# Patient Record
Sex: Female | Born: 1960 | Race: White | Hispanic: No | Marital: Married | Smoking: Former smoker
Health system: Southern US, Community
[De-identification: ages and names within clinical notes are randomized; demographics above are authoritative.]

## PROBLEM LIST (undated history)

## (undated) DIAGNOSIS — F329 Major depressive disorder, single episode, unspecified: Secondary | ICD-10-CM

## (undated) DIAGNOSIS — F32A Depression, unspecified: Secondary | ICD-10-CM

## (undated) HISTORY — DX: Depression, unspecified: F32.A

## (undated) HISTORY — PX: APPENDECTOMY: SHX54

## (undated) HISTORY — DX: Major depressive disorder, single episode, unspecified: F32.9

---

## 2009-10-06 ENCOUNTER — Emergency Department (HOSPITAL_COMMUNITY): Admission: EM | Admit: 2009-10-06 | Discharge: 2009-10-06 | Payer: Self-pay | Admitting: Emergency Medicine

## 2012-10-21 ENCOUNTER — Ambulatory Visit: Payer: Self-pay | Admitting: Obstetrics & Gynecology

## 2012-11-28 ENCOUNTER — Other Ambulatory Visit: Payer: Self-pay | Admitting: Rheumatology

## 2012-11-28 DIAGNOSIS — M79672 Pain in left foot: Secondary | ICD-10-CM

## 2012-11-30 ENCOUNTER — Ambulatory Visit
Admission: RE | Admit: 2012-11-30 | Discharge: 2012-11-30 | Disposition: A | Discharge status: Home / Self Care | Payer: 59 | Source: Ambulatory Visit | Attending: Rheumatology | Admitting: Rheumatology

## 2012-11-30 DIAGNOSIS — M79672 Pain in left foot: Secondary | ICD-10-CM

## 2013-01-20 ENCOUNTER — Ambulatory Visit (INDEPENDENT_AMBULATORY_CARE_PROVIDER_SITE_OTHER): Payer: 59 | Admitting: Obstetrics & Gynecology

## 2013-01-20 ENCOUNTER — Encounter: Payer: Self-pay | Admitting: Obstetrics & Gynecology

## 2013-01-20 ENCOUNTER — Other Ambulatory Visit: Payer: Self-pay | Admitting: Obstetrics & Gynecology

## 2013-01-20 VITALS — BP 117/78 | HR 70 | Temp 98.6°F | Ht 68.0 in | Wt 195.0 lb

## 2013-01-20 DIAGNOSIS — Z1231 Encounter for screening mammogram for malignant neoplasm of breast: Secondary | ICD-10-CM

## 2013-01-20 DIAGNOSIS — Z01419 Encounter for gynecological examination (general) (routine) without abnormal findings: Secondary | ICD-10-CM | POA: Insufficient documentation

## 2013-01-20 NOTE — Progress Notes (Signed)
.   Subjective:     Colleen Smith is a 52 y.o. female here for a routine exam and she would like her thyroid level check today.  No current complaints.  Personal health questionnaire reviewed: no.   Gynecologic History No LMP recorded. Patient is postmenopausal. Contraception: none Last Pap: 2012. Results were: normal Last mammogram: 2012. Results were: normal  Obstetric History OB History   Grav Para Term Preterm Abortions TAB SAB Ect Mult Living                   The following portions of the patient's history were reviewed and updated as appropriate: allergies, current medications, past family history, past medical history, past social history, past surgical history and problem list.  Review of Systems Pertinent items are noted in HPI.    Objective:    General appearance: alert Breasts: normal appearance, no masses or tenderness Abdomen: soft, non-tender; bowel sounds normal; no masses,  no organomegaly Pelvic: cervix normal in appearance, external genitalia normal, no adnexal masses or tenderness, uterus normal size, shape, and consistency and vagina normal without discharge      Assessment:    Healthy female exam.    Plan:    Education reviewed: smoking cessation. Mammogram ordered. Follow up prn.

## 2013-01-20 NOTE — Patient Instructions (Addendum)

## 2013-01-21 LAB — CHOLESTEROL, TOTAL: Cholesterol: 191 mg/dL (ref 0–200)

## 2013-01-21 LAB — HEMOGLOBIN A1C: Mean Plasma Glucose: 105 mg/dL (ref <117)

## 2013-01-21 LAB — TSH: TSH: 1.9 u[IU]/mL (ref 0.350–4.500)

## 2013-01-22 LAB — PAP IG, CT-NG NAA, HPV HIGH-RISK: HPV DNA High Risk: NOT DETECTED

## 2013-02-17 ENCOUNTER — Ambulatory Visit
Admission: RE | Admit: 2013-02-17 | Discharge: 2013-02-17 | Disposition: A | Discharge status: Home / Self Care | Payer: 59 | Source: Ambulatory Visit | Attending: Obstetrics & Gynecology | Admitting: Obstetrics & Gynecology

## 2013-02-17 DIAGNOSIS — Z1231 Encounter for screening mammogram for malignant neoplasm of breast: Secondary | ICD-10-CM

## 2014-01-21 ENCOUNTER — Ambulatory Visit: Payer: 59 | Admitting: Obstetrics & Gynecology

## 2014-02-17 ENCOUNTER — Encounter: Payer: Self-pay | Admitting: Podiatrist

## 2014-02-17 ENCOUNTER — Ambulatory Visit (INDEPENDENT_AMBULATORY_CARE_PROVIDER_SITE_OTHER): Payer: 59 | Admitting: Podiatrist

## 2014-02-17 ENCOUNTER — Ambulatory Visit (INDEPENDENT_AMBULATORY_CARE_PROVIDER_SITE_OTHER): Payer: 59

## 2014-02-17 VITALS — BP 123/72 | HR 68 | Resp 18

## 2014-02-17 DIAGNOSIS — R52 Pain, unspecified: Secondary | ICD-10-CM

## 2014-02-17 DIAGNOSIS — IMO0002 Reserved for concepts with insufficient information to code with codable children: Secondary | ICD-10-CM

## 2014-02-17 DIAGNOSIS — M792 Neuralgia and neuritis, unspecified: Secondary | ICD-10-CM

## 2014-02-17 DIAGNOSIS — G894 Chronic pain syndrome: Secondary | ICD-10-CM

## 2014-02-17 MED ORDER — PREGABALIN 75 MG PO CAPS: 75.0000 mg | ORAL_CAPSULE | Freq: Two times a day (BID) | ORAL | Status: DC

## 2014-02-17 NOTE — Progress Notes (Signed)
   Subjective:    Patient ID: Colleen Smith, female    DOB: 08/11/1960, 53 y.o.   MRN: [id number]  HPI my left foot is still hurting and sore and tender and constant pain up to my knee and ice packs and take over the counter medicines and swelling and burning and throbbing and I did have surgery on my left foot about 5 years with Dr Amalia Hailey     Review of Systems  Musculoskeletal: Positive for gait problem.       MUSCLE PAIN  All other systems reviewed and are negative.      Objective:   Physical Exam Patient is awake, alert, and oriented x 3.  In no acute distress.  Vascular status is intact with palpable pedal pulses at 2/4 DP and PT bilateral and capillary refill time within normal limits. Neurological sensation is also intact bilaterally via Semmes Weinstein monofilament at 5/5 sites. Light touch, vibratory sensation intact and hyper response is elicited, Achilles tendon reflex is intact. Dermatological exam reveals skin color, turger and texture as normal. No open lesions present.  Musculature intact with dorsiflexion, plantarflexion, inversion, eversion. No tenting of the pin. It appears to be in a good position radiographically.  Her complaints are mostly subjective and are nerve/ neuritis related.  She had a EMG study in the past that was negative.     Assessment & Plan:  Neuritis, pain syndrome  Plan: Recommended a trial of Lyrica 75-150 mg nightly for 3 weeks to see if this helps with any of the symptoms she is experiencing. We did discuss removing the pin however her symptoms don't appear to be from a retained pin.  We discussed TENS units and PT may consider these at the next visit.

## 2014-02-24 ENCOUNTER — Telehealth: Payer: Self-pay | Admitting: *Deleted

## 2014-02-24 MED ORDER — GABAPENTIN 300 MG PO CAPS: ORAL_CAPSULE | ORAL | Status: DC

## 2014-02-24 NOTE — Telephone Encounter (Signed)
Called patient and stated that DR EGERTON WROTE FOR GABAPENTIN. LISA

## 2014-02-24 NOTE — Telephone Encounter (Signed)
Sure I will switch the medication to gabapentin 300 qhs and q dinner

## 2014-03-10 ENCOUNTER — Ambulatory Visit: Payer: 59 | Admitting: Podiatrist

## 2014-03-17 ENCOUNTER — Ambulatory Visit: Payer: 59 | Admitting: Podiatrist

## 2014-03-24 ENCOUNTER — Ambulatory Visit: Payer: 59 | Admitting: Podiatrist

## 2014-03-31 ENCOUNTER — Ambulatory Visit (INDEPENDENT_AMBULATORY_CARE_PROVIDER_SITE_OTHER): Payer: 59 | Admitting: Podiatrist

## 2014-03-31 ENCOUNTER — Encounter: Payer: Self-pay | Admitting: Podiatrist

## 2014-03-31 VITALS — BP 115/74 | HR 75 | Resp 18

## 2014-03-31 DIAGNOSIS — M792 Neuralgia and neuritis, unspecified: Secondary | ICD-10-CM

## 2014-03-31 DIAGNOSIS — IMO0002 Reserved for concepts with insufficient information to code with codable children: Secondary | ICD-10-CM

## 2014-03-31 MED ORDER — GABAPENTIN 300 MG PO CAPS: ORAL_CAPSULE | ORAL | Status: DC

## 2014-03-31 NOTE — Patient Instructions (Signed)
There is a great neurosurgery group in [place] that can help figure out if you have sciatic nerve problems and help treat.    Dr. Earnie Larsson-- [facility]  [phone]

## 2014-04-08 NOTE — Progress Notes (Addendum)
Chief Complaint  Patient presents with  . Foot Problem    it is better and the medicine seems to be helping and some days are better than  others     HPI: Patient is 53 y.o. female who presents today for folllow up of neuritis symptomatology on the left foot.  She states the lyrica does appear to be helping some.   Physical Exam  Patient is awake, alert, and oriented x 3. In no acute distress. Vascular status is intact with palpable pedal pulses at 2/4 DP and PT bilateral and capillary refill time within normal limits. Neurological sensation is also intact bilaterally via Semmes Weinstein monofilament at 5/5 sites. Light touch, vibratory sensation intact and hyper response is elicited, Achilles tendon reflex is intact. Dermatological exam reveals skin color, turger and texture as normal. No open lesions present. Musculature intact with dorsiflexion, plantarflexion, inversion, eversion.  Subjective findings of nerve pain/ neuritis related. She had a EMG study in the past that was negative.   Assessment & Plan:   Neuritis, pain syndrome , chronic intractable pain  Plan: recommended continued use of the lyrica.  Also discussed a TENS  Unit sock for the patient as I think this will help with her chronic intractable pain as well.  We will see about setting her up for this needed modality.

## 2014-04-27 ENCOUNTER — Ambulatory Visit: Payer: 59 | Admitting: Obstetrics & Gynecology

## 2014-06-15 ENCOUNTER — Ambulatory Visit: Payer: 59 | Admitting: Obstetrics & Gynecology

## 2014-07-13 ENCOUNTER — Encounter: Payer: Self-pay | Admitting: *Deleted

## 2014-07-14 ENCOUNTER — Encounter: Payer: Self-pay | Admitting: Obstetrics & Gynecology

## 2014-07-21 ENCOUNTER — Telehealth: Payer: Self-pay | Admitting: *Deleted

## 2014-07-21 NOTE — Telephone Encounter (Signed)
Pt request to speak with Melody.

## 2014-07-21 NOTE — Telephone Encounter (Signed)
Tried to return patients phone call left message to call me back

## 2014-07-22 ENCOUNTER — Telehealth: Payer: Self-pay | Admitting: *Deleted

## 2014-07-22 MED ORDER — GABAPENTIN 300 MG PO CAPS: 300.0000 mg | ORAL_CAPSULE | Freq: Two times a day (BID) | ORAL | Status: DC

## 2014-07-22 NOTE — Telephone Encounter (Signed)
Called patient and let her know Rx was called in to pharmacy

## 2014-07-22 NOTE — Telephone Encounter (Signed)
PT HAS REQUESTED REFILL OF THE GABAPENTIN THROUGH PHARMACY HAS NOT HEARD ANYTHING ONLY HAS A FEW DAYS LEFT CAN YOU PLEASE CALL IN REFILLS

## 2014-07-22 NOTE — Telephone Encounter (Signed)
I CALLED IN A REFILL REQUEST TO PHARMACY LAST WEEK AND HAVE NOT GOTTEN A RESPONSE CAN YOU PLEASE CALL IN REFILLS FOR THE GABAPENTIN.  I ONLY HAVE A FEW DAYS LEFT THANK YOU

## 2014-07-22 NOTE — Telephone Encounter (Signed)
rx for gabapentin 300 bid called in

## 2014-09-17 ENCOUNTER — Ambulatory Visit: Payer: 59 | Admitting: Obstetrics & Gynecology

## 2014-10-06 ENCOUNTER — Ambulatory Visit (INDEPENDENT_AMBULATORY_CARE_PROVIDER_SITE_OTHER): Payer: 59 | Admitting: Obstetrics

## 2014-10-06 ENCOUNTER — Encounter: Payer: Self-pay | Admitting: Obstetrics

## 2014-10-06 VITALS — BP 130/88 | HR 70 | Temp 97.6°F | Ht 68.0 in | Wt 211.0 lb

## 2014-10-06 DIAGNOSIS — Z124 Encounter for screening for malignant neoplasm of cervix: Secondary | ICD-10-CM

## 2014-10-06 DIAGNOSIS — N841 Polyp of cervix uteri: Secondary | ICD-10-CM | POA: Diagnosis not present

## 2014-10-06 DIAGNOSIS — Z01419 Encounter for gynecological examination (general) (routine) without abnormal findings: Secondary | ICD-10-CM

## 2014-10-06 NOTE — Progress Notes (Addendum)
Subjective:        Colleen Smith is a 54 y.o. female here for a routine exam.  Current complaints: None.    Personal health questionnaire:  Is patient Ashkenazi Jewish, have a family history of breast and/or ovarian cancer: no Is there a family history of uterine cancer diagnosed at age < 41, gastrointestinal cancer, urinary tract cancer, family member who is a Field seismologist syndrome-associated carrier: no Is the patient overweight and hypertensive, family history of diabetes, personal history of gestational diabetes, preeclampsia or PCOS: no Is patient over 60, have PCOS,  family history of premature CHD under age 70, diabetes, smoke, have hypertension or peripheral artery disease:  no At any time, has a partner hit, kicked or otherwise hurt or frightened you?: no Over the past 2 weeks, have you felt down, depressed or hopeless?: no Over the past 2 weeks, have you felt little interest or pleasure in doing things?:no   Gynecologic History Patient's last menstrual period was 09/07/2014. Contraception: none Last Pap: 2014. Results were: normal Last mammogram: 2014. Results were: normal  Obstetric History OB History  No data available    Past Medical History  Diagnosis Date  . Depression     Past Surgical History  Procedure Laterality Date  . Appendectomy       Current outpatient prescriptions:  .  buPROPion (WELLBUTRIN XL) 300 MG 24 hr tablet, Take 300 mg by mouth daily., Disp: , Rfl:  .  gabapentin (NEURONTIN) 300 MG capsule, Take 1 capsule (300 mg total) by mouth 2 (two) times daily. 2 tabs at dinner and 1 just before going to sleep, Disp: 90 capsule, Rfl: 4 .  venlafaxine XR (EFFEXOR-XR) 150 MG 24 hr capsule, , Disp: , Rfl:  Allergies  Allergen Reactions  . Penicillins Rash    History  Substance Use Topics  . Smoking status: Light Tobacco Smoker -- 0.50 packs/day for 15 years  . Smokeless tobacco: Never Used  . Alcohol Use: No    History reviewed. No pertinent family  history.    Review of Systems  Constitutional: negative for fatigue and weight loss Respiratory: negative for cough and wheezing Cardiovascular: negative for chest pain, fatigue and palpitations Gastrointestinal: negative for abdominal pain and change in bowel habits Musculoskeletal:negative for myalgias Neurological: negative for gait problems and tremors Behavioral/Psych: negative for abusive relationship, depression Endocrine: negative for temperature intolerance   Genitourinary:negative for abnormal menstrual periods, genital lesions, hot flashes, sexual problems and vaginal discharge Integument/breast: negative for breast lump, breast tenderness, nipple discharge and skin lesion(s)    Objective:       BP 130/88 mmHg  Pulse 70  Temp(Src) 97.6 F (36.4 C)  Ht 5\' 8"  (1.727 m)  Wt 211 lb (95.709 kg)  BMI 32.09 kg/m2  LMP 09/07/2014 General:   alert  Skin:   no rash or abnormalities  Lungs:   clear to auscultation bilaterally  Heart:   regular rate and rhythm, S1, S2 normal, no murmur, click, rub or gallop  Breasts:   normal without suspicious masses, skin or nipple changes or axillary nodes  Abdomen:  normal findings: no organomegaly, soft, non-tender and no hernia  Pelvis:  External genitalia: normal general appearance Urinary system: urethral meatus normal and bladder without fullness, nontender Vaginal: normal without tenderness, induration or masses Cervix: endocervical polyp, broad based Adnexa: normal bimanual exam Uterus: anteverted and non-tender, normal size   Lab Review Urine pregnancy test Labs reviewed yes Radiologic studies reviewed yes    Assessment:  Healthy female exam.    Cervical polyp, broad based.   Plan:    Education reviewed: calcium supplements, low fat, low cholesterol diet, self breast exams and weight bearing exercise. Mammogram ordered. Follow up in: 2 weeks. Schedule outpatient cervical polypectomy   No orders of the defined  types were placed in this encounter.   Orders Placed This Encounter  Procedures  . SureSwab, Vaginosis/Vaginitis Plus  . Comprehensive metabolic panel  . TSH  . CBC

## 2014-10-07 LAB — CBC
HCT: 45 % (ref 36.0–46.0)
HEMOGLOBIN: 14.9 g/dL (ref 12.0–15.0)
MCH: 29.9 pg (ref 26.0–34.0)
MCHC: 33.1 g/dL (ref 30.0–36.0)
MCV: 90.2 fL (ref 78.0–100.0)
MPV: 10.9 fL (ref 8.6–12.4)
PLATELETS: 341 10*3/uL (ref 150–400)
RBC: 4.99 MIL/uL (ref 3.87–5.11)
RDW: 14.4 % (ref 11.5–15.5)
WBC: 8.2 10*3/uL (ref 4.0–10.5)

## 2014-10-07 LAB — COMPREHENSIVE METABOLIC PANEL
ALK PHOS: 55 U/L (ref 39–117)
ALT: 18 U/L (ref 0–35)
AST: 20 U/L (ref 0–37)
Albumin: 4.7 g/dL (ref 3.5–5.2)
BILIRUBIN TOTAL: 0.3 mg/dL (ref 0.2–1.2)
BUN: 15 mg/dL (ref 6–23)
CO2: 22 meq/L (ref 19–32)
Calcium: 9.4 mg/dL (ref 8.4–10.5)
Chloride: 102 mEq/L (ref 96–112)
Creat: 0.97 mg/dL (ref 0.50–1.10)
Glucose, Bld: 71 mg/dL (ref 70–99)
Potassium: 4.4 mEq/L (ref 3.5–5.3)
SODIUM: 137 meq/L (ref 135–145)
TOTAL PROTEIN: 7.4 g/dL (ref 6.0–8.3)

## 2014-10-07 LAB — TSH: TSH: 3.402 u[IU]/mL (ref 0.350–4.500)

## 2014-10-08 LAB — PAP IG AND HPV HIGH-RISK: HPV DNA HIGH RISK: DETECTED — AB

## 2014-10-08 LAB — SURESWAB, VAGINOSIS/VAGINITIS PLUS
Atopobium vaginae: 6.5 Log (cells/mL)
BV CATEGORY: UNDETERMINED — AB
C. GLABRATA, DNA: NOT DETECTED
C. TROPICALIS, DNA: NOT DETECTED
C. albicans, DNA: NOT DETECTED
C. parapsilosis, DNA: NOT DETECTED
C. trachomatis RNA, TMA: NOT DETECTED
LACTOBACILLUS SPECIES: 7.6 Log (cells/mL)
MEGASPHAERA SPECIES: 8 Log (cells/mL)
N. gonorrhoeae RNA, TMA: NOT DETECTED
T. VAGINALIS RNA, QL TMA: NOT DETECTED

## 2014-10-29 ENCOUNTER — Other Ambulatory Visit: Payer: Self-pay | Admitting: Obstetrics

## 2014-10-29 DIAGNOSIS — B9689 Other specified bacterial agents as the cause of diseases classified elsewhere: Secondary | ICD-10-CM

## 2014-10-29 DIAGNOSIS — N760 Acute vaginitis: Principal | ICD-10-CM

## 2014-10-29 MED ORDER — TINIDAZOLE 500 MG PO TABS: 1000.0000 mg | ORAL_TABLET | Freq: Every day | ORAL | Status: DC

## 2014-10-30 ENCOUNTER — Other Ambulatory Visit: Payer: Self-pay | Admitting: Obstetrics

## 2014-10-30 ENCOUNTER — Ambulatory Visit (INDEPENDENT_AMBULATORY_CARE_PROVIDER_SITE_OTHER): Payer: 59 | Admitting: Obstetrics

## 2014-10-30 VITALS — BP 121/79 | HR 87 | Temp 98.1°F | Wt 212.0 lb

## 2014-10-30 DIAGNOSIS — G8918 Other acute postprocedural pain: Secondary | ICD-10-CM

## 2014-10-30 DIAGNOSIS — Z01812 Encounter for preprocedural laboratory examination: Secondary | ICD-10-CM | POA: Diagnosis not present

## 2014-10-30 DIAGNOSIS — N841 Polyp of cervix uteri: Secondary | ICD-10-CM | POA: Diagnosis not present

## 2014-10-30 DIAGNOSIS — R87612 Low grade squamous intraepithelial lesion on cytologic smear of cervix (LGSIL): Secondary | ICD-10-CM

## 2014-10-30 LAB — POCT URINE PREGNANCY: PREG TEST UR: NEGATIVE

## 2014-10-30 MED ORDER — HYDROCODONE-IBUPROFEN 5-200 MG PO TABS: 1.0000 | ORAL_TABLET | Freq: Three times a day (TID) | ORAL | Status: DC | PRN

## 2014-10-30 NOTE — Progress Notes (Signed)
Colposcopy Procedure Note Cervical Polypectomy Note  Indications: Pap smear 1 months ago showed: low-grade squamous intraepithelial neoplasia (LGSIL - encompassing HPV,mild dysplasia,CIN I). The prior pap showed no abnormalities.  Prior cervical/vaginal disease: normal exam without visible pathology. Prior cervical treatment: no treatment.  Procedure Details  The risks and benefits of the procedure and Written informed consent obtained.  A time-out was performed confirming the patient, procedure and allergy status  Speculum placed in vagina and excellent visualization of cervix achieved, cervix swabbed x 3 with acetic acid solution.  Findings: Cervix: endocervical polyp ~ 1-2 cm size; SCJ visualized 360 degrees without lesions, endocervical curettage performed, cervical  taken at 6 and 12 o'clock, specimen labelled and sent to pathology and hemostasis achieved with silver nitrate.  The endocervical polyp then grasped with a dressing forcep and twisted off at its base and submitted to pathology.  Hemostasis of base of polyp obtained with silver nitrate.   Vaginal inspection: normal without visible lesions. Vulvar colposcopy: vulvar colposcopy not performed.   Physical Exam   Specimens: ECC, Cervical Biopsies, Cervical Polyp  Complications: none.  Plan: Specimens labelled and sent to Pathology. Will base further treatment on Pathology findings. Post biopsy instructions given to patient. Return to discuss Pathology results in 2 weeks.

## 2014-11-03 ENCOUNTER — Other Ambulatory Visit: Payer: Self-pay | Admitting: Obstetrics

## 2014-11-03 LAB — SURESWAB, VAGINOSIS/VAGINITIS PLUS
ATOPOBIUM VAGINAE: 7.3 Log (cells/mL)
C. ALBICANS, DNA: NOT DETECTED
C. PARAPSILOSIS, DNA: NOT DETECTED
C. TRACHOMATIS RNA, TMA: NOT DETECTED
C. glabrata, DNA: NOT DETECTED
C. tropicalis, DNA: NOT DETECTED
Gardnerella vaginalis: >8 Log (cells/mL)
LACTOBACILLUS SPECIES: NOT DETECTED Log (cells/mL)
MEGASPHAERA SPECIES: 8 Log (cells/mL)
N. gonorrhoeae RNA, TMA: NOT DETECTED
T. VAGINALIS RNA, QL TMA: NOT DETECTED

## 2014-11-04 ENCOUNTER — Telehealth: Payer: Self-pay | Admitting: *Deleted

## 2014-11-04 NOTE — Telephone Encounter (Signed)
Patient request call back. 4/20  10:00 Call to patient- patient reports she is still having spotting and some cramping. Reassured patient- she may be starting a cycle - she is to call if fever,increase pain and heavy bleeding. Patient notified of her results and recommended follow up and she is to keep her appointment for follow up to check cervix. Patient understands and will call if she feels she needs to be seen.

## 2014-11-18 ENCOUNTER — Ambulatory Visit: Payer: 59 | Admitting: Obstetrics

## 2014-11-25 ENCOUNTER — Ambulatory Visit (INDEPENDENT_AMBULATORY_CARE_PROVIDER_SITE_OTHER): Payer: 59 | Admitting: Obstetrics

## 2014-11-25 ENCOUNTER — Encounter: Payer: Self-pay | Admitting: Obstetrics

## 2014-11-25 VITALS — BP 108/74 | HR 82 | Wt 207.0 lb

## 2014-11-25 DIAGNOSIS — R896 Abnormal cytological findings in specimens from other organs, systems and tissues: Secondary | ICD-10-CM | POA: Diagnosis not present

## 2014-11-25 DIAGNOSIS — IMO0002 Reserved for concepts with insufficient information to code with codable children: Secondary | ICD-10-CM

## 2014-11-25 NOTE — Progress Notes (Signed)
Patient ID: Colleen Smith, female   DOB: Jun 24, 1961, 54 y.o.   MRN: [id number]  Chief Complaint  Patient presents with  . Follow-up    colposcopy    HPI Colleen Smith is a 54 y.o. female.  Presents for results of colposcopic biopsies.  H/O LGSIL on pap smear. HPI  Past Medical History  Diagnosis Date  . Depression     Past Surgical History  Procedure Laterality Date  . Appendectomy      History reviewed. No pertinent family history.  Social History History  Substance Use Topics  . Smoking status: Light Tobacco Smoker -- 0.50 packs/day for 15 years  . Smokeless tobacco: Never Used  . Alcohol Use: No    Allergies  Allergen Reactions  . Penicillins Rash    Current Outpatient Prescriptions  Medication Sig Dispense Refill  . buPROPion (WELLBUTRIN XL) 300 MG 24 hr tablet Take 300 mg by mouth daily.    Marland Kitchen gabapentin (NEURONTIN) 300 MG capsule Take 1 capsule (300 mg total) by mouth 2 (two) times daily. 2 tabs at dinner and 1 just before going to sleep 90 capsule 4  . hydrocodone-ibuprofen (VICOPROFEN) 5-200 MG per tablet Take 1 tablet by mouth every 8 (eight) hours as needed for pain. 40 tablet 0  . tinidazole (TINDAMAX) 500 MG tablet Take 2 tablets (1,000 mg total) by mouth daily with breakfast. 10 tablet 2  . venlafaxine XR (EFFEXOR-XR) 150 MG 24 hr capsule      No current facility-administered medications for this visit.    Review of Systems Review of Systems Constitutional: negative for fatigue and weight loss Respiratory: negative for cough and wheezing Cardiovascular: negative for chest pain, fatigue and palpitations Gastrointestinal: negative for abdominal pain and change in bowel habits Genitourinary:negative Integument/breast: negative for nipple discharge Musculoskeletal:negative for myalgias Neurological: negative for gait problems and tremors Behavioral/Psych: negative for abusive relationship, depression Endocrine: negative for temperature intolerance      Blood pressure 108/74, pulse 82, weight 207 lb (93.895 kg), last menstrual period 10/09/2014.  Physical Exam Physical Exam:  Deferred  100% of 10 min visit spent on counseling and coordination of care.    Data Reviewed Pap smear Pathology  Assessment     LGSIL     Plan    Repeat pap 6 months.  No orders of the defined types were placed in this encounter.   No orders of the defined types were placed in this encounter.

## 2014-12-08 ENCOUNTER — Telehealth: Payer: Self-pay | Admitting: *Deleted

## 2014-12-08 NOTE — Telephone Encounter (Signed)
Pt request call from Melody, left only DOB, and phone number.

## 2014-12-11 NOTE — Telephone Encounter (Signed)
Patient called to see if we had received request for her medication refill.  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

## 2014-12-22 ENCOUNTER — Telehealth: Payer: Self-pay | Admitting: *Deleted

## 2014-12-22 MED ORDER — GABAPENTIN 300 MG PO CAPS: 300.0000 mg | ORAL_CAPSULE | Freq: Two times a day (BID) | ORAL | Status: DC

## 2014-12-22 NOTE — Telephone Encounter (Signed)
Pt request refill of Gabapentin, while in office for appt with Melody Leslee Home.  Dr. Paulla Dolly ordered refill Gabapentin as previously and have pt established with a [facility] doctor prior to future refills.

## 2015-02-19 ENCOUNTER — Ambulatory Visit (INDEPENDENT_AMBULATORY_CARE_PROVIDER_SITE_OTHER): Payer: 59 | Admitting: Podiatry

## 2015-02-19 ENCOUNTER — Encounter: Payer: Self-pay | Admitting: Podiatry

## 2015-02-19 ENCOUNTER — Ambulatory Visit (INDEPENDENT_AMBULATORY_CARE_PROVIDER_SITE_OTHER): Payer: 59

## 2015-02-19 VITALS — BP 123/76 | HR 65 | Resp 14

## 2015-02-19 DIAGNOSIS — M79672 Pain in left foot: Secondary | ICD-10-CM

## 2015-02-19 DIAGNOSIS — Z472 Encounter for removal of internal fixation device: Secondary | ICD-10-CM | POA: Diagnosis not present

## 2015-02-19 DIAGNOSIS — L600 Ingrowing nail: Secondary | ICD-10-CM | POA: Diagnosis not present

## 2015-02-20 NOTE — Progress Notes (Signed)
Subjective:     Patient ID: Colleen Smith, female   DOB: [birth year], 54 y.o.   MRN: [id number]  HPI patient states she has had continued pain in her left first metatarsal and states that the pin is bothering her that had been placed previous surgery and the big toenail and second nail have really been bothering her. States that the gabapentin helps her some at night but she's not sure if the dose needs to be changed   Review of Systems  All other systems reviewed and are negative.      Objective:   Physical Exam  Constitutional: She is oriented to person, place, and time.  Cardiovascular: Intact distal pulses.   Musculoskeletal: Normal range of motion.  Neurological: She is oriented to person, place, and time.  Skin: Skin is warm and dry.  Nursing note and vitals reviewed.  neurovascular status unchanged with patient having good digital perfusion and noted to having discomfort on the first metatarsal shaft left with discomfort that radiates into the big toe with incurvation of the nailbed left hallux on both medial lateral side and pain when palpated. Second nail is thickened and she cannot cut it and she has tried to trim it and pad it. States she's tried numerous treatments for her foot including long-term gabapentin and other anti-inflammatory's without relief of symptoms along with orthotics     Assessment:     Inflammatory changes with possible irritation to pin left first metatarsal with probable ingrown toenail is part of pathology but very difficult condition to discuss long-term or whether will not we'll be able to achieve permanent relief of symptoms    Plan:     Reviewed H&P and x-ray and do to long-term symptoms it's been recommended that pin removal be accomplished at this time along with nail removal of the big toe and second toenail. Patient wants this done absolutely understanding that this will probably not reduce all symptoms that she is experiencing and it's very  possible that long-term she will need increase in her gabapentin and also possible TENS unit to try to help reduce her chronic pain she experiences at this point though she wants to pursue surgical intervention and I allowed her to read consent form reviewing alternative treatments and complications associated with procedures. Patient wants the surgery and at this time signs consent form and we reviewed procedures and risk. She is scheduled for outpatient surgery [facility] surgical center and she was given ample opportunities to answer questions. Preoperative instructions dispensed and scheduled for surgery and encouraged to call with questions

## 2015-02-23 ENCOUNTER — Telehealth: Payer: Self-pay | Admitting: *Deleted

## 2015-02-23 NOTE — Telephone Encounter (Signed)
I called patient in error.  I told her to ignore message.  We have you scheduled for 03/09/2015.

## 2015-02-24 ENCOUNTER — Telehealth: Payer: Self-pay | Admitting: *Deleted

## 2015-02-24 NOTE — Telephone Encounter (Signed)
"  I seen the doctor on Thursday.  He scheduled me for surgery.  I don't see the time on my paperwork.  If I do not answer, leave me a message.  If I have to, I'll return your call."

## 2015-02-25 ENCOUNTER — Telehealth: Payer: Self-pay | Admitting: *Deleted

## 2015-02-25 NOTE — Telephone Encounter (Signed)
I'm returning your call from yesterday.  "I've already spoke to someone and they gave me the time."  Was it the surgical center?  "I think so.  Thanks for calling."

## 2015-02-25 NOTE — Telephone Encounter (Signed)
I called and informed Caren Griffins that patient's authorization number for surgery scheduled for 03/09/2015 is [id number].

## 2015-03-09 ENCOUNTER — Encounter: Payer: Self-pay | Admitting: *Deleted

## 2015-03-09 DIAGNOSIS — L600 Ingrowing nail: Secondary | ICD-10-CM | POA: Diagnosis not present

## 2015-03-09 DIAGNOSIS — Z4889 Encounter for other specified surgical aftercare: Secondary | ICD-10-CM | POA: Diagnosis not present

## 2015-03-10 ENCOUNTER — Telehealth: Payer: Self-pay | Admitting: *Deleted

## 2015-03-10 NOTE — Telephone Encounter (Signed)
Pt states surgery yesterday to remove pins and ingrown toenail procedure, and the toenail area is killing her.  I instructed pt to remove the toenail dressing only, cleanse the area with antibacterial soap, rinse well and cover with a neosporin bandaid, but to keep the remain dressing dry and in place until seen at [facility].  Pt states understanding.

## 2015-03-15 ENCOUNTER — Encounter: Payer: Self-pay | Admitting: Podiatry

## 2015-03-15 ENCOUNTER — Ambulatory Visit (INDEPENDENT_AMBULATORY_CARE_PROVIDER_SITE_OTHER): Payer: 59

## 2015-03-15 ENCOUNTER — Ambulatory Visit (INDEPENDENT_AMBULATORY_CARE_PROVIDER_SITE_OTHER): Payer: 59 | Admitting: Podiatry

## 2015-03-15 VITALS — BP 118/74 | HR 71 | Resp 16

## 2015-03-15 DIAGNOSIS — Z472 Encounter for removal of internal fixation device: Secondary | ICD-10-CM | POA: Diagnosis not present

## 2015-03-15 DIAGNOSIS — L600 Ingrowing nail: Secondary | ICD-10-CM

## 2015-03-15 DIAGNOSIS — Z9889 Other specified postprocedural states: Secondary | ICD-10-CM | POA: Diagnosis not present

## 2015-03-15 MED ORDER — GABAPENTIN 300 MG PO CAPS: 300.0000 mg | ORAL_CAPSULE | Freq: Two times a day (BID) | ORAL | Status: DC

## 2015-03-15 NOTE — Patient Instructions (Signed)

## 2015-03-15 NOTE — Progress Notes (Signed)
Subjective:     Patient ID: Colleen Smith, female   DOB: Sep 25, 1960, 54 y.o.   MRN: [id number]  HPI patient states I'm doing fine with my left foot but I need these right big toenail second nail removed as they are also painful and a fungus to be out of work this week it's a good time to do it. I've had minimal discomfort with the left   Review of Systems     Objective:   Physical Exam Neurovascular status intact muscle strength adequate with well-healing surgical site left first metatarsal and left hallux and second nailbeds with a bed that is healing well with no proximal edema erythema drainage noted. The right shows incurvated hallux and second nails that are painful when pressed from a dorsal direction    Assessment:     Healing well post surgery left foot with damaged incurvated hallux and second nail right    Plan:     Reviewed both conditions and applied sterile dressings to the left. For the right I went ahead and I recommended nail removal and explained procedure and risk and patient wants surgery. I went ahead and infiltrated with 120 mg I can Marcaine mixture applied sterile prep and remove the hallux and second nail right exposed matrix and applied phenol 5 applications to the hallux or applications to the second toe followed by alcohol lavage and sterile dressing. Gave instructions on soaks and reappoint

## 2015-03-16 ENCOUNTER — Telehealth: Payer: Self-pay | Admitting: *Deleted

## 2015-03-16 NOTE — Telephone Encounter (Signed)
Called patient at [phone] (Cell #) to check to see how they were doing from their ingrown toenail procedure that was done yesterday, March 15, 2015. Pt stated, "Toe throbbed last night". Pt took ibuprofen yesterday with some relief. Pt also stated, "toe is still sore, but feels better". Pt did soak toe this morning with some relief.

## 2015-03-18 ENCOUNTER — Telehealth: Payer: Self-pay

## 2015-03-18 NOTE — Telephone Encounter (Signed)
Spoke with pt regarding post op care and nail avulsion care. She stated that her toes were sore and slightly red from procedure. She denied any blistering or increased pain in the area. Advised to continue with soaking toes and watch for increased redness and drainage. She had concerns about going back to work and continuing to wear Darco shoe. I advised her to continue with Darco shoe and try to transition in a supportive shoe if she can tolerate it. Call with any questions or concerns

## 2015-03-24 NOTE — Progress Notes (Signed)
Surgery performed at [facility]; Removal Fixation Deep Kwire/ Screw and Exc. Nail Perm 1st and 2nd digit left foot.  Prescription for Vicodin 10/325 was written for pain,quantity 25.

## 2015-03-25 ENCOUNTER — Encounter: Payer: Self-pay | Admitting: Podiatry

## 2015-03-25 ENCOUNTER — Ambulatory Visit (INDEPENDENT_AMBULATORY_CARE_PROVIDER_SITE_OTHER): Payer: 59 | Admitting: Podiatry

## 2015-03-25 VITALS — BP 123/79 | HR 76 | Resp 16

## 2015-03-25 DIAGNOSIS — Z472 Encounter for removal of internal fixation device: Secondary | ICD-10-CM

## 2015-03-25 DIAGNOSIS — L600 Ingrowing nail: Secondary | ICD-10-CM

## 2015-03-25 DIAGNOSIS — M779 Enthesopathy, unspecified: Secondary | ICD-10-CM

## 2015-03-25 DIAGNOSIS — Z9889 Other specified postprocedural states: Secondary | ICD-10-CM

## 2015-03-25 NOTE — Progress Notes (Signed)
Subjective:     Patient ID: Colleen Smith, female   DOB: 05/13/1961, 54 y.o.   MRN: [id number]  HPI patient states I'm doing fine but I still have pain on top of my left foot   Review of Systems     Objective:   Physical Exam Neurovascular status unchanged with nails healing well hallux second left hallux second right with removal of pin wound edges coapted well and discomfort in the dorsum of the left foot around the second and third metatarsal bones    Assessment:     Inflammatory tendinitis left with well-healing surgical sites bilateral    Plan:     Review tendinitis and considerations for heat and ice therapy and long-term orthotics. Reappoint for [place] to reevaluate

## 2015-04-23 ENCOUNTER — Ambulatory Visit (INDEPENDENT_AMBULATORY_CARE_PROVIDER_SITE_OTHER): Payer: 59

## 2015-04-23 ENCOUNTER — Ambulatory Visit (INDEPENDENT_AMBULATORY_CARE_PROVIDER_SITE_OTHER): Payer: 59 | Admitting: Podiatry

## 2015-04-23 VITALS — BP 139/73 | HR 72 | Resp 16

## 2015-04-23 DIAGNOSIS — M779 Enthesopathy, unspecified: Secondary | ICD-10-CM | POA: Diagnosis not present

## 2015-04-23 DIAGNOSIS — B351 Tinea unguium: Secondary | ICD-10-CM | POA: Diagnosis not present

## 2015-04-23 DIAGNOSIS — Z472 Encounter for removal of internal fixation device: Secondary | ICD-10-CM

## 2015-04-23 DIAGNOSIS — L03019 Cellulitis of unspecified finger: Secondary | ICD-10-CM | POA: Diagnosis not present

## 2015-04-23 DIAGNOSIS — Z9889 Other specified postprocedural states: Secondary | ICD-10-CM

## 2015-04-23 DIAGNOSIS — IMO0002 Reserved for concepts with insufficient information to code with codable children: Secondary | ICD-10-CM

## 2015-04-23 MED ORDER — CEPHALEXIN 500 MG PO CAPS: 500.0000 mg | ORAL_CAPSULE | Freq: Two times a day (BID) | ORAL | Status: DC

## 2015-04-23 MED ORDER — TERBINAFINE HCL 250 MG PO TABS: ORAL_TABLET | ORAL | Status: DC

## 2015-04-24 NOTE — Progress Notes (Signed)
Subjective:     Patient ID: Colleen Smith, female   DOB: Sep 20, 1960, 54 y.o.   MRN: [id number]  HPI patient states I'm doing okay with the pins are removed and I don't think I'm getting quite the same pain but I do have pain in my feet in general and the nailbeds on my left are not quite healing as quickly as I would like   Review of Systems     Objective:   Physical Exam Neurovascular status intact muscle strength adequate range of motion within normal limits with patient having slight drainage on the dorsum of the left second toe where we removed and nail previously and moderate discomfort plantar aspect both feet. When I move the big toe joint. No longer hurts which is a good sign after having fixation removal    Assessment:     Generalized tendinitis of both feet along with left nailbed that's not healing completely at this time    Plan:     Reviewed all conditions and x-ray the left foot and today I went ahead and scanned for custom orthotics to reduce plantar pressures against her feet. I placed her on antibiotics cephalexin twice a day to try to help with the continued drainage and if any increased redness or other issues were to occur she is to let us know immediately

## 2015-05-14 ENCOUNTER — Ambulatory Visit: Payer: 59 | Admitting: *Deleted

## 2015-05-14 DIAGNOSIS — M779 Enthesopathy, unspecified: Secondary | ICD-10-CM

## 2015-05-14 NOTE — Progress Notes (Signed)
Patient ID: Colleen Smith, female   DOB: 04/02/1961, 54 y.o.   MRN: [id number] Patient presents for orthotic pick up.  Verbal and written break in and wear instructions given.  Patient will follow up in 4 weeks if symptoms worsen or fail to improve.

## 2015-05-14 NOTE — Patient Instructions (Signed)

## 2015-05-31 ENCOUNTER — Ambulatory Visit: Payer: 59 | Admitting: Obstetrics

## 2015-06-14 ENCOUNTER — Ambulatory Visit: Payer: 59 | Admitting: Podiatry

## 2015-06-21 ENCOUNTER — Ambulatory Visit: Payer: 59 | Admitting: Obstetrics

## 2015-10-04 ENCOUNTER — Telehealth: Payer: Self-pay | Admitting: *Deleted

## 2015-10-04 NOTE — Telephone Encounter (Addendum)
Pt states she is having problems with the bottom of her feet again and wanted to know if Dr. Paulla Dolly would refill as the 7 days on and 3 weeks off for 4 months again.  10/05/2015-Dr. Regal ordered refill the Lamisil as previously.  Orders called to pt and pharmacy.

## 2015-10-05 ENCOUNTER — Telehealth: Payer: Self-pay | Admitting: *Deleted

## 2015-10-05 MED ORDER — TERBINAFINE HCL 250 MG PO TABS: ORAL_TABLET | ORAL | Status: DC

## 2015-10-05 NOTE — Telephone Encounter (Signed)
Entered in error

## 2015-11-02 NOTE — Telephone Encounter (Signed)
Entered in error

## 2015-12-14 ENCOUNTER — Ambulatory Visit (INDEPENDENT_AMBULATORY_CARE_PROVIDER_SITE_OTHER): Payer: BLUE CROSS/BLUE SHIELD | Admitting: Obstetrics

## 2015-12-14 ENCOUNTER — Encounter: Payer: Self-pay | Admitting: Obstetrics

## 2015-12-14 VITALS — BP 126/81 | HR 65 | Temp 98.5°F | Wt 205.0 lb

## 2015-12-14 DIAGNOSIS — Z780 Asymptomatic menopausal state: Secondary | ICD-10-CM

## 2015-12-14 DIAGNOSIS — IMO0002 Reserved for concepts with insufficient information to code with codable children: Secondary | ICD-10-CM

## 2015-12-14 DIAGNOSIS — Z01419 Encounter for gynecological examination (general) (routine) without abnormal findings: Secondary | ICD-10-CM | POA: Diagnosis not present

## 2015-12-14 NOTE — Progress Notes (Signed)
Subjective:        Colleen Smith is a 55 y.o. female here for a routine exam.  Current complaints: None.    Personal health questionnaire:  Is patient Ashkenazi Jewish, have a family history of breast and/or ovarian cancer: no Is there a family history of uterine cancer diagnosed at age < 27, gastrointestinal cancer, urinary tract cancer, family member who is a Field seismologist syndrome-associated carrier: no Is the patient overweight and hypertensive, family history of diabetes, personal history of gestational diabetes, preeclampsia or PCOS: no Is patient over 53, have PCOS,  family history of premature CHD under age 17, diabetes, smoke, have hypertension or peripheral artery disease:  no At any time, has a partner hit, kicked or otherwise hurt or frightened you?: no Over the past 2 weeks, have you felt down, depressed or hopeless?: no Over the past 2 weeks, have you felt little interest or pleasure in doing things?:no   Gynecologic History Patient's last menstrual period was 10/09/2014 (exact date). Contraception: post menopausal status Last Pap: 2016. Results were: abnormal ( LGSIL, positive HPV ) Last mammogram: 2016. Results were: normal  Obstetric History OB History  No data available    Past Medical History  Diagnosis Date  . Depression     Past Surgical History  Procedure Laterality Date  . Appendectomy       Current outpatient prescriptions:  .  buPROPion (WELLBUTRIN XL) 300 MG 24 hr tablet, Take 300 mg by mouth daily., Disp: , Rfl:  .  sertraline (ZOLOFT) 100 MG tablet, Take 150 mg by mouth daily., Disp: , Rfl:  Allergies  Allergen Reactions  . Vicodin [Hydrocodone-Acetaminophen] Itching  . Penicillins Rash    Social History  Substance Use Topics  . Smoking status: Former Smoker -- 0.50 packs/day for 15 years  . Smokeless tobacco: Never Used  . Alcohol Use: No    History reviewed. No pertinent family history.    Review of Systems  Constitutional: negative for  fatigue and weight loss Respiratory: negative for cough and wheezing Cardiovascular: negative for chest pain, fatigue and palpitations Gastrointestinal: negative for abdominal pain and change in bowel habits Musculoskeletal:negative for myalgias Neurological: negative for gait problems and tremors Behavioral/Psych: negative for abusive relationship, depression Endocrine: negative for temperature intolerance   Genitourinary:negative for abnormal menstrual periods, genital lesions, hot flashes, sexual problems and vaginal discharge Integument/breast: negative for breast lump, breast tenderness, nipple discharge and skin lesion(s)    Objective:       BP 126/81 mmHg  Pulse 65  Temp(Src) 98.5 F (36.9 C)  Wt 205 lb (92.987 kg)  LMP 10/09/2014 (Exact Date) General:   alert  Skin:   no rash or abnormalities  Lungs:   clear to auscultation bilaterally  Heart:   regular rate and rhythm, S1, S2 normal, no murmur, click, rub or gallop  Breasts:   normal without suspicious masses, skin or nipple changes or axillary nodes  Abdomen:  normal findings: no organomegaly, soft, non-tender and no hernia  Pelvis:  External genitalia: normal general appearance Urinary system: urethral meatus normal and bladder without fullness, nontender Vaginal: normal without tenderness, induration or masses Cervix: normal appearance Adnexa: normal bimanual exam Uterus: anteverted and non-tender, normal size   Lab Review Urine pregnancy test Labs reviewed yes Radiologic studies reviewed yes   Assessment:    Healthy female exam.   Postmenopause.  Stable clinically.  LGSIL of cervix.  Colposcopic biopsies confirm.  Plan:    Repeat pap yearly.  Education reviewed:  calcium supplements, depression evaluation, low fat, low cholesterol diet, safe sex/STD prevention, self breast exams and weight bearing exercise. Follow up in: 1 year.   Meds ordered this encounter  Medications  . sertraline (ZOLOFT) 100 MG  tablet    Sig: Take 150 mg by mouth daily.   No orders of the defined types were placed in this encounter.

## 2015-12-17 LAB — NUSWAB VG+, CANDIDA 6SP
CANDIDA ALBICANS, NAA: NEGATIVE
CANDIDA PARAPSILOSIS, NAA: NEGATIVE
CHLAMYDIA TRACHOMATIS, NAA: NEGATIVE
Candida glabrata, NAA: NEGATIVE
Candida krusei, NAA: NEGATIVE
Candida lusitaniae, NAA: NEGATIVE
Candida tropicalis, NAA: NEGATIVE
Neisseria gonorrhoeae, NAA: NEGATIVE
Trich vag by NAA: NEGATIVE

## 2015-12-18 LAB — PAP IG AND HPV HIGH-RISK: PAP Smear Comment: 0

## 2015-12-18 LAB — HPV, LOW VOLUME (REFLEX): HPV low volume reflex: POSITIVE — AB

## 2015-12-20 ENCOUNTER — Ambulatory Visit: Payer: Self-pay | Admitting: Podiatry

## 2016-01-05 ENCOUNTER — Ambulatory Visit: Payer: Self-pay | Admitting: Podiatry

## 2016-12-14 ENCOUNTER — Ambulatory Visit: Payer: Self-pay | Admitting: Obstetrics

## 2016-12-19 ENCOUNTER — Ambulatory Visit (INDEPENDENT_AMBULATORY_CARE_PROVIDER_SITE_OTHER): Payer: BLUE CROSS/BLUE SHIELD | Admitting: Obstetrics

## 2016-12-19 ENCOUNTER — Other Ambulatory Visit: Payer: Self-pay | Admitting: Obstetrics

## 2016-12-19 ENCOUNTER — Encounter: Payer: Self-pay | Admitting: Obstetrics

## 2016-12-19 DIAGNOSIS — Z01419 Encounter for gynecological examination (general) (routine) without abnormal findings: Secondary | ICD-10-CM | POA: Diagnosis not present

## 2016-12-19 DIAGNOSIS — N951 Menopausal and female climacteric states: Secondary | ICD-10-CM

## 2016-12-19 DIAGNOSIS — Z113 Encounter for screening for infections with a predominantly sexual mode of transmission: Secondary | ICD-10-CM | POA: Diagnosis not present

## 2016-12-19 DIAGNOSIS — Z1231 Encounter for screening mammogram for malignant neoplasm of breast: Secondary | ICD-10-CM

## 2016-12-19 NOTE — Addendum Note (Signed)
Addended by: Valli Glance F on: 12/19/2016 11:58 AM   Modules accepted: Orders

## 2016-12-19 NOTE — Progress Notes (Signed)
Patient ID: Colleen Smith, female   DOB: 1960/11/26, 56 y.o.   MRN: [id number]

## 2016-12-19 NOTE — Progress Notes (Signed)
Subjective:        Colleen Smith is a 56 y.o. female here for a routine exam.  Current complaints: None.  Quit smoking last year.  Periods are starting to get irregular, but no intermenstrual vaginal bleeding.  Denies vasomotor symptoms.  Personal health questionnaire:  Is patient Ashkenazi Jewish, have a family history of breast and/or ovarian cancer: no Is there a family history of uterine cancer diagnosed at age < 66, gastrointestinal cancer, urinary tract cancer, family member who is a Field seismologist syndrome-associated carrier: no Is the patient overweight and hypertensive, family history of diabetes, personal history of gestational diabetes, preeclampsia or PCOS: no Is patient over 70, have PCOS,  family history of premature CHD under age 38, diabetes, smoke, have hypertension or peripheral artery disease:  no At any time, has a partner hit, kicked or otherwise hurt or frightened you?: no Over the past 2 weeks, have you felt down, depressed or hopeless?: no Over the past 2 weeks, have you felt little interest or pleasure in doing things?:no   Gynecologic History Patient's last menstrual period was 10/09/2014 (exact date). Contraception: none Last Pap: 2017. Results were: normal Last mammogram: 2016. Results were: normal  Obstetric History OB History  No data available    Past Medical History:  Diagnosis Date  . Depression     Past Surgical History:  Procedure Laterality Date  . APPENDECTOMY       Current Outpatient Prescriptions:  .  buPROPion (WELLBUTRIN XL) 300 MG 24 hr tablet, Take 300 mg by mouth daily., Disp: , Rfl:  .  sertraline (ZOLOFT) 100 MG tablet, Take 150 mg by mouth daily., Disp: , Rfl:  Allergies  Allergen Reactions  . Vicodin [Hydrocodone-Acetaminophen] Itching  . Penicillins Rash    Social History  Substance Use Topics  . Smoking status: Former Smoker    Packs/day: 0.50    Years: 15.00    Quit date: 11/2015  . Smokeless tobacco: Never Used  .  Alcohol use No    History reviewed. No pertinent family history.    Review of Systems  Constitutional: negative for fatigue and weight loss Respiratory: negative for cough and wheezing Cardiovascular: negative for chest pain, fatigue and palpitations Gastrointestinal: negative for abdominal pain and change in bowel habits Musculoskeletal:negative for myalgias Neurological: negative for gait problems and tremors Behavioral/Psych: negative for abusive relationship, depression Endocrine: negative for temperature intolerance    Genitourinary:negative for abnormal menstrual periods, genital lesions, hot flashes, sexual problems and vaginal discharge Integument/breast: negative for breast lump, breast tenderness, nipple discharge and skin lesion(s)    Objective:       LMP 10/09/2014 (Exact Date)  General:   alert  Skin:   no rash or abnormalities  Lungs:   clear to auscultation bilaterally  Heart:   regular rate and rhythm, S1, S2 normal, no murmur, click, rub or gallop  Breasts:   normal without suspicious masses, skin or nipple changes or axillary nodes  Abdomen:  normal findings: no organomegaly, soft, non-tender and no hernia  Pelvis:  External genitalia: normal general appearance Urinary system: urethral meatus normal and bladder without fullness, nontender Vaginal: normal without tenderness, induration or masses Cervix: normal appearance Adnexa: normal bimanual exam Uterus: anteverted and non-tender, normal size   Lab Review Urine pregnancy test Labs reviewed yes Radiologic studies reviewed yes  50% of 20 min visit spent on counseling and coordination of care.    Assessment:    Healthy female exam.    Perimenopausal.  Doing  well   Plan:    Education reviewed: calcium supplements, depression evaluation, low fat, low cholesterol diet, safe sex/STD prevention, self breast exams, skin cancer screening and weight bearing exercise. Follow up in: 1 year.   No orders of  the defined types were placed in this encounter.  No orders of the defined types were placed in this encounter.    Patient is in the office for annual exam, last pap 12-14-15. Pt states that she stopped smoking last May.

## 2016-12-21 LAB — CYTOLOGY - PAP
Diagnosis: NEGATIVE
HPV: DETECTED — AB

## 2016-12-21 LAB — CERVICOVAGINAL ANCILLARY ONLY
BACTERIAL VAGINITIS: NEGATIVE
CANDIDA VAGINITIS: NEGATIVE
TRICH (WINDOWPATH): NEGATIVE

## 2017-01-09 ENCOUNTER — Ambulatory Visit
Admission: RE | Admit: 2017-01-09 | Discharge: 2017-01-09 | Disposition: A | Discharge status: Home / Self Care | Payer: BLUE CROSS/BLUE SHIELD | Source: Ambulatory Visit | Attending: Obstetrics | Admitting: Obstetrics

## 2017-01-09 DIAGNOSIS — Z1231 Encounter for screening mammogram for malignant neoplasm of breast: Secondary | ICD-10-CM

## 2018-01-13 DIAGNOSIS — J209 Acute bronchitis, unspecified: Secondary | ICD-10-CM | POA: Diagnosis not present

## 2018-04-04 DIAGNOSIS — Z23 Encounter for immunization: Secondary | ICD-10-CM | POA: Diagnosis not present

## 2018-06-03 ENCOUNTER — Ambulatory Visit (INDEPENDENT_AMBULATORY_CARE_PROVIDER_SITE_OTHER): Payer: 59 | Admitting: Obstetrics

## 2018-06-03 ENCOUNTER — Encounter: Payer: Self-pay | Admitting: Obstetrics

## 2018-06-03 ENCOUNTER — Other Ambulatory Visit (HOSPITAL_COMMUNITY)
Admission: RE | Admit: 2018-06-03 | Discharge: 2018-06-03 | Disposition: A | Discharge status: Home / Self Care | Payer: 59 | Source: Ambulatory Visit | Attending: Obstetrics | Admitting: Obstetrics

## 2018-06-03 ENCOUNTER — Other Ambulatory Visit: Payer: Self-pay

## 2018-06-03 VITALS — BP 122/73 | HR 81 | Ht 68.0 in | Wt 207.3 lb

## 2018-06-03 DIAGNOSIS — Z0000 Encounter for general adult medical examination without abnormal findings: Secondary | ICD-10-CM | POA: Diagnosis not present

## 2018-06-03 DIAGNOSIS — Z01419 Encounter for gynecological examination (general) (routine) without abnormal findings: Secondary | ICD-10-CM | POA: Diagnosis not present

## 2018-06-03 DIAGNOSIS — R1010 Upper abdominal pain, unspecified: Secondary | ICD-10-CM

## 2018-06-03 DIAGNOSIS — Z23 Encounter for immunization: Secondary | ICD-10-CM

## 2018-06-03 NOTE — Progress Notes (Signed)
Subjective:        Colleen Smith is a 57 y.o. female here for a routine exam.  Current complaints: Upper abdominal pain after eating..    Personal health questionnaire:  Is patient Ashkenazi Jewish, have a family history of breast and/or ovarian cancer: no Is there a family history of uterine cancer diagnosed at age < 43, gastrointestinal cancer, urinary tract cancer, family member who is a Field seismologist syndrome-associated carrier: no Is the patient overweight and hypertensive, family history of diabetes, personal history of gestational diabetes, preeclampsia or PCOS: no Is patient over 72, have PCOS,  family history of premature CHD under age 80, diabetes, smoke, have hypertension or peripheral artery disease:  no At any time, has a partner hit, kicked or otherwise hurt or frightened you?: no Over the past 2 weeks, have you felt down, depressed or hopeless?: no Over the past 2 weeks, have you felt little interest or pleasure in doing things?:no   Gynecologic History Patient's last menstrual period was 10/09/2014 (exact date). Contraception: post menopausal status Last Pap: 2018. Results were: NILM, but positive HPV Last mammogram: 2018. Results were: normal  Obstetric History OB History  No data available    Past Medical History:  Diagnosis Date  . Depression     Past Surgical History:  Procedure Laterality Date  . APPENDECTOMY       Current Outpatient Medications:  .  buPROPion (WELLBUTRIN XL) 300 MG 24 hr tablet, Take 300 mg by mouth daily., Disp: , Rfl:  .  sertraline (ZOLOFT) 100 MG tablet, Take 150 mg by mouth daily., Disp: , Rfl:  Allergies  Allergen Reactions  . Vicodin [Hydrocodone-Acetaminophen] Itching  . Penicillins Rash    Social History   Tobacco Use  . Smoking status: Former Smoker    Packs/day: 0.50    Years: 15.00    Pack years: 7.50    Last attempt to quit: 11/2015    Years since quitting: 2.5  . Smokeless tobacco: Never Used  Substance Use Topics   . Alcohol use: No    Alcohol/week: 0.0 standard drinks    History reviewed. No pertinent family history.    Review of Systems  Constitutional: negative for fatigue and weight loss Respiratory: negative for cough and wheezing Cardiovascular: negative for chest pain, fatigue and palpitations Gastrointestinal: negative for abdominal pain and change in bowel habits Musculoskeletal:negative for myalgias Neurological: negative for gait problems and tremors Behavioral/Psych: negative for abusive relationship, depression Endocrine: negative for temperature intolerance    Genitourinary:negative for abnormal menstrual periods, genital lesions, hot flashes, sexual problems and vaginal discharge Integument/breast: negative for breast lump, breast tenderness, nipple discharge and skin lesion(s)    Objective:       BP 122/73   Pulse 81   Ht 5\' 8"  (1.727 m)   Wt 207 lb 4.8 oz (94 kg)   LMP 10/09/2014 (Exact Date)   BMI 31.52 kg/m  General:   alert  Skin:   no rash or abnormalities  Lungs:   clear to auscultation bilaterally  Heart:   regular rate and rhythm, S1, S2 normal, no murmur, click, rub or gallop  Breasts:   normal without suspicious masses, skin or nipple changes or axillary nodes  Abdomen:  normal findings: SUBXIPHOID TENDERNESS.  Soft.  No organomegaly, masses, and no hernia  Pelvis:  External genitalia: normal general appearance Urinary system: urethral meatus normal and bladder without fullness, nontender Vaginal: normal without tenderness, induration or masses Cervix: normal appearance Adnexa: normal bimanual exam  Uterus: anteverted and non-tender, normal size   Lab Review Urine pregnancy test Labs reviewed yes Radiologic studies reviewed yes  50% of 20 min visit spent on counseling and coordination of care.   Assessment:     1. Encounter for routine gynecological examination with Papanicolaou smear of cervix Rx: - Cytology - PAP( [place])  2. Routine  adult health maintenance Rx: - Tdap vaccine greater than or equal to 7yo IM - HIV Antibody (routine testing w rflx) - Hepatitis B surface antigen - RPR - Hepatitis C antibody - Comprehensive metabolic panel - TSH - Cholesterol, total - HDL cholesterol - Triglycerides - CBC - Lipid Profile - Ambulatory referral to Gastroenterology - MM Digital Screening; Future  3. Pain of upper abdomen Rx: - US Abdomen Complete; Future    Plan:    Education reviewed: calcium supplements, depression evaluation, low fat, low cholesterol diet, safe sex/STD prevention, self breast exams and weight bearing exercise. Mammogram ordered. Follow up in: 1 year.   No orders of the defined types were placed in this encounter.  Orders Placed This Encounter  Procedures  . MM Digital Screening    [employer]   Pf 01/09/2017 bcg,  No issues, no needs, no hx, no implants  Kat/ deentra from office    Standing Status:   Future    Standing Expiration Date:   08/04/2019    Order Specific Question:   Reason for Exam (SYMPTOM  OR DIAGNOSIS REQUIRED)    Answer:   Screening    Order Specific Question:   Is the patient pregnant?    Answer:   No    Order Specific Question:   Preferred imaging location?    Answer:   [facility]  . US Abdomen Complete    Upper abdominal pain after eating.  R/O gallstones. Epic     Standing Status:   Future    Standing Expiration Date:   08/04/2019    Order Specific Question:   Reason for Exam (SYMPTOM  OR DIAGNOSIS REQUIRED)    Answer:   Upper abdominal pain after eating.  R/O gallstones.    Order Specific Question:   Preferred imaging location?    Answer:   [facility]  . Tdap vaccine greater than or equal to 7yo IM  . HIV Antibody (routine testing w rflx)  . Hepatitis B surface antigen  . RPR  . Hepatitis C antibody  . Comprehensive metabolic panel  . TSH  . Cholesterol, total  . HDL cholesterol  . Triglycerides  . CBC  . Lipid Profile  . Ambulatory  referral to Gastroenterology    Referral Priority:   Routine    Referral Type:   Consultation    Referral Reason:   Specialty Services Required    Number of Visits Requested:   1    Shelly Bombard MD 06-03-2018

## 2018-06-03 NOTE — Progress Notes (Signed)
Presents for AEX/PAP.  Last Mammogram 01/09/17.    FLU done 03/27/18.  C/o pain in center of rib cage after eating.

## 2018-06-04 LAB — CBC
HEMOGLOBIN: 13.9 g/dL (ref 11.1–15.9)
Hematocrit: 41.2 % (ref 34.0–46.6)
MCH: 29.1 pg (ref 26.6–33.0)
MCHC: 33.7 g/dL (ref 31.5–35.7)
MCV: 86 fL (ref 79–97)
Platelets: 361 10*3/uL (ref 150–450)
RBC: 4.78 x10E6/uL (ref 3.77–5.28)
RDW: 12.4 % (ref 12.3–15.4)
WBC: 7 10*3/uL (ref 3.4–10.8)

## 2018-06-04 LAB — LIPID PANEL
CHOLESTEROL TOTAL: 211 mg/dL — AB (ref 100–199)
Chol/HDL Ratio: 4.5 ratio — ABNORMAL HIGH (ref 0.0–4.4)
HDL: 47 mg/dL (ref >39)
LDL CALC: 126 mg/dL — AB (ref 0–99)
TRIGLYCERIDES: 191 mg/dL — AB (ref 0–149)
VLDL Cholesterol Cal: 38 mg/dL (ref 5–40)

## 2018-06-04 LAB — COMPREHENSIVE METABOLIC PANEL
ALK PHOS: 60 IU/L (ref 39–117)
ALT: 20 IU/L (ref 0–32)
AST: 16 IU/L (ref 0–40)
Albumin/Globulin Ratio: 1.7 (ref 1.2–2.2)
Albumin: 4.3 g/dL (ref 3.5–5.5)
BUN/Creatinine Ratio: 12 (ref 9–23)
BUN: 12 mg/dL (ref 6–24)
CHLORIDE: 106 mmol/L (ref 96–106)
CO2: 22 mmol/L (ref 20–29)
Calcium: 9.3 mg/dL (ref 8.7–10.2)
Creatinine, Ser: 1 mg/dL (ref 0.57–1.00)
GFR calc Af Amer: 72 mL/min/{1.73_m2} (ref >59)
GFR calc non Af Amer: 63 mL/min/{1.73_m2} (ref >59)
GLUCOSE: 72 mg/dL (ref 65–99)
Globulin, Total: 2.5 g/dL (ref 1.5–4.5)
Potassium: 4.3 mmol/L (ref 3.5–5.2)
Sodium: 144 mmol/L (ref 134–144)
Total Protein: 6.8 g/dL (ref 6.0–8.5)

## 2018-06-04 LAB — HEPATITIS B SURFACE ANTIGEN: HEP B S AG: NEGATIVE

## 2018-06-04 LAB — HIV ANTIBODY (ROUTINE TESTING W REFLEX): HIV SCREEN 4TH GENERATION: NONREACTIVE

## 2018-06-04 LAB — TSH: TSH: 2.79 u[IU]/mL (ref 0.450–4.500)

## 2018-06-04 LAB — RPR: RPR Ser Ql: NONREACTIVE

## 2018-06-04 LAB — HEPATITIS C ANTIBODY: HEP C VIRUS AB: 0.1 {s_co_ratio} (ref 0.0–0.9)

## 2018-06-05 LAB — CYTOLOGY - PAP
HPV 16/18/45 genotyping: NEGATIVE
HPV: DETECTED — AB

## 2018-06-18 DIAGNOSIS — R5383 Other fatigue: Secondary | ICD-10-CM | POA: Diagnosis not present

## 2018-06-24 ENCOUNTER — Encounter: Payer: 59 | Admitting: Obstetrics

## 2018-06-24 ENCOUNTER — Other Ambulatory Visit: Payer: 59

## 2018-07-14 DIAGNOSIS — S233XXA Sprain of ligaments of thoracic spine, initial encounter: Secondary | ICD-10-CM | POA: Diagnosis not present

## 2018-07-14 DIAGNOSIS — M79604 Pain in right leg: Secondary | ICD-10-CM | POA: Diagnosis not present

## 2018-07-14 DIAGNOSIS — S93401A Sprain of unspecified ligament of right ankle, initial encounter: Secondary | ICD-10-CM | POA: Diagnosis not present

## 2018-07-16 ENCOUNTER — Encounter: Payer: 59 | Admitting: Obstetrics

## 2018-07-16 ENCOUNTER — Ambulatory Visit: Payer: 59

## 2018-08-14 ENCOUNTER — Ambulatory Visit (INDEPENDENT_AMBULATORY_CARE_PROVIDER_SITE_OTHER): Payer: 59 | Admitting: Obstetrics

## 2018-08-14 ENCOUNTER — Other Ambulatory Visit (HOSPITAL_COMMUNITY)
Admission: RE | Admit: 2018-08-14 | Discharge: 2018-08-14 | Disposition: A | Discharge status: Home / Self Care | Payer: 59 | Source: Ambulatory Visit | Attending: Obstetrics | Admitting: Obstetrics

## 2018-08-14 VITALS — BP 111/63 | HR 65 | Wt 200.0 lb

## 2018-08-14 DIAGNOSIS — N898 Other specified noninflammatory disorders of vagina: Secondary | ICD-10-CM

## 2018-08-14 DIAGNOSIS — N879 Dysplasia of cervix uteri, unspecified: Secondary | ICD-10-CM | POA: Diagnosis not present

## 2018-08-14 DIAGNOSIS — Z113 Encounter for screening for infections with a predominantly sexual mode of transmission: Secondary | ICD-10-CM | POA: Diagnosis not present

## 2018-08-14 DIAGNOSIS — R87610 Atypical squamous cells of undetermined significance on cytologic smear of cervix (ASC-US): Secondary | ICD-10-CM

## 2018-08-14 DIAGNOSIS — N870 Mild cervical dysplasia: Secondary | ICD-10-CM

## 2018-08-14 NOTE — Progress Notes (Signed)
Colposcopy Procedure Note  Indications: Pap smear 2 months ago showed: ASCUS with NEGATIVE high risk HPV. The prior pap showed no abnormalities.  Prior cervical/vaginal disease: normal exam without visible pathology. Prior cervical treatment: no treatment.  Procedure Details  The risks and benefits of the procedure and Written informed consent obtained.  A time-out was performed confirming the patient, procedure and allergy status  Speculum placed in vagina and excellent visualization of cervix achieved, cervix swabbed x 3 with acetic acid solution.  Findings: Cervix: no visible lesions; SCJ visualized 360 degrees without lesions, endocervical curettage performed, cervical biopsies taken at 6 and 12 o'clock, specimen labelled and sent to pathology and hemostasis achieved with silver nitrate.   Vaginal inspection: normal without visible lesions. Vulvar colposcopy: vulvar colposcopy not performed.   Physical Exam   Specimens: ECC and Cervical Biopsies  Complications: none.  Plan: Specimens labelled and sent to Pathology. Will base further treatment on Pathology findings. Treatment options discussed with patient. Post biopsy instructions given to patient. Return to discuss Pathology results in 2 weeks    Shelly Bombard MD 08-14-2018.

## 2018-08-15 LAB — CERVICOVAGINAL ANCILLARY ONLY
Bacterial vaginitis: NEGATIVE
CANDIDA VAGINITIS: NEGATIVE
Trichomonas: NEGATIVE

## 2018-08-16 ENCOUNTER — Encounter: Payer: Self-pay | Admitting: Obstetrics

## 2018-08-19 ENCOUNTER — Other Ambulatory Visit: Payer: Self-pay | Admitting: Obstetrics

## 2018-08-26 ENCOUNTER — Encounter: Payer: Self-pay | Admitting: Obstetrics

## 2018-09-09 ENCOUNTER — Encounter: Payer: Self-pay | Admitting: Obstetrics and Gynecology

## 2018-09-09 ENCOUNTER — Ambulatory Visit (INDEPENDENT_AMBULATORY_CARE_PROVIDER_SITE_OTHER): Payer: 59 | Admitting: Obstetrics and Gynecology

## 2018-09-09 ENCOUNTER — Other Ambulatory Visit (HOSPITAL_COMMUNITY)
Admission: RE | Admit: 2018-09-09 | Discharge: 2018-09-09 | Disposition: A | Discharge status: Home / Self Care | Payer: 59 | Source: Ambulatory Visit | Attending: Obstetrics and Gynecology | Admitting: Obstetrics and Gynecology

## 2018-09-09 VITALS — BP 112/74 | HR 86 | Wt 203.0 lb

## 2018-09-09 DIAGNOSIS — R87611 Atypical squamous cells cannot exclude high grade squamous intraepithelial lesion on cytologic smear of cervix (ASC-H): Secondary | ICD-10-CM | POA: Insufficient documentation

## 2018-09-09 DIAGNOSIS — N870 Mild cervical dysplasia: Secondary | ICD-10-CM

## 2018-09-09 NOTE — Progress Notes (Signed)
Patient identified, informed consent obtained, signed copy in chart, time out performed.  Pap smear and colposcopy reviewed.   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  Cervix visualized. Paracervical block placed.  A small size LOOP used to remove cone of cervix using blend of cut and cautery on LEEP machine.  Edges/Base cauterized with Ball.  Monsel's solution used for hemostasis.  Patient tolerated procedure well.  Patient given post procedure instructions.  Follow up in 12 months for repeat pap or as needed.

## 2018-10-29 ENCOUNTER — Emergency Department (HOSPITAL_COMMUNITY): Payer: 59

## 2018-10-29 ENCOUNTER — Encounter (HOSPITAL_COMMUNITY): Payer: Self-pay | Admitting: Emergency Medicine

## 2018-10-29 ENCOUNTER — Other Ambulatory Visit: Payer: Self-pay

## 2018-10-29 ENCOUNTER — Inpatient Hospital Stay (HOSPITAL_COMMUNITY)
Admission: EM | Admit: 2018-10-29 | Discharge: 2018-11-04 | DRG: 644 | Disposition: A | Discharge status: Home Health | Payer: 59 | Attending: Internal Medicine | Admitting: Internal Medicine

## 2018-10-29 DIAGNOSIS — Z87891 Personal history of nicotine dependence: Secondary | ICD-10-CM

## 2018-10-29 DIAGNOSIS — E871 Hypo-osmolality and hyponatremia: Secondary | ICD-10-CM | POA: Diagnosis present

## 2018-10-29 DIAGNOSIS — A084 Viral intestinal infection, unspecified: Secondary | ICD-10-CM

## 2018-10-29 DIAGNOSIS — E860 Dehydration: Secondary | ICD-10-CM | POA: Diagnosis present

## 2018-10-29 DIAGNOSIS — M791 Myalgia, unspecified site: Secondary | ICD-10-CM | POA: Diagnosis not present

## 2018-10-29 DIAGNOSIS — F329 Major depressive disorder, single episode, unspecified: Secondary | ICD-10-CM | POA: Diagnosis present

## 2018-10-29 DIAGNOSIS — E2740 Unspecified adrenocortical insufficiency: Principal | ICD-10-CM | POA: Diagnosis present

## 2018-10-29 DIAGNOSIS — Z880 Allergy status to penicillin: Secondary | ICD-10-CM | POA: Diagnosis not present

## 2018-10-29 DIAGNOSIS — R8271 Bacteriuria: Secondary | ICD-10-CM

## 2018-10-29 DIAGNOSIS — E039 Hypothyroidism, unspecified: Secondary | ICD-10-CM | POA: Diagnosis present

## 2018-10-29 DIAGNOSIS — Z79899 Other long term (current) drug therapy: Secondary | ICD-10-CM | POA: Diagnosis not present

## 2018-10-29 DIAGNOSIS — D352 Benign neoplasm of pituitary gland: Secondary | ICD-10-CM | POA: Diagnosis present

## 2018-10-29 DIAGNOSIS — J0100 Acute maxillary sinusitis, unspecified: Secondary | ICD-10-CM | POA: Diagnosis present

## 2018-10-29 DIAGNOSIS — E861 Hypovolemia: Secondary | ICD-10-CM | POA: Diagnosis present

## 2018-10-29 DIAGNOSIS — Z885 Allergy status to narcotic agent status: Secondary | ICD-10-CM | POA: Diagnosis not present

## 2018-10-29 LAB — URINALYSIS, ROUTINE W REFLEX MICROSCOPIC
Bilirubin Urine: NEGATIVE
Glucose, UA: NEGATIVE mg/dL
Hgb urine dipstick: NEGATIVE
Ketones, ur: 20 mg/dL — AB
Nitrite: NEGATIVE
Protein, ur: NEGATIVE mg/dL
Specific Gravity, Urine: 1.006 (ref 1.005–1.030)
pH: 5 (ref 5.0–8.0)

## 2018-10-29 LAB — CBC WITH DIFFERENTIAL/PLATELET
Abs Immature Granulocytes: 0.01 10*3/uL (ref 0.00–0.07)
Basophils Absolute: 0.1 10*3/uL (ref 0.0–0.1)
Basophils Relative: 1 %
Eosinophils Absolute: 0.2 10*3/uL (ref 0.0–0.5)
Eosinophils Relative: 3 %
HCT: 37.3 % (ref 36.0–46.0)
Hemoglobin: 13.7 g/dL (ref 12.0–15.0)
Immature Granulocytes: 0 %
Lymphocytes Relative: 57 %
Lymphs Abs: 3.7 10*3/uL (ref 0.7–4.0)
MCH: 29.7 pg (ref 26.0–34.0)
MCHC: 36.7 g/dL — ABNORMAL HIGH (ref 30.0–36.0)
MCV: 80.7 fL (ref 80.0–100.0)
Monocytes Absolute: 0.4 10*3/uL (ref 0.1–1.0)
Monocytes Relative: 5 %
Neutro Abs: 2.2 10*3/uL (ref 1.7–7.7)
Neutrophils Relative %: 34 %
Platelets: 287 10*3/uL (ref 150–400)
RBC: 4.62 MIL/uL (ref 3.87–5.11)
RDW: 11.8 % (ref 11.5–15.5)
WBC: 6.5 10*3/uL (ref 4.0–10.5)
nRBC: 0 % (ref 0.0–0.2)

## 2018-10-29 LAB — BASIC METABOLIC PANEL
Anion gap: 9 (ref 5–15)
BUN: 7 mg/dL (ref 6–20)
CO2: 21 mmol/L — ABNORMAL LOW (ref 22–32)
Calcium: 7.8 mg/dL — ABNORMAL LOW (ref 8.9–10.3)
Chloride: 81 mmol/L — ABNORMAL LOW (ref 98–111)
Creatinine, Ser: 0.85 mg/dL (ref 0.44–1.00)
GFR calc Af Amer: >60 mL/min (ref >60)
GFR calc non Af Amer: >60 mL/min (ref >60)
Glucose, Bld: 72 mg/dL (ref 70–99)
Potassium: 3.7 mmol/L (ref 3.5–5.1)
Sodium: 111 mmol/L — CL (ref 135–145)

## 2018-10-29 LAB — COMPREHENSIVE METABOLIC PANEL
ALT: 24 U/L (ref 0–44)
AST: 40 U/L (ref 15–41)
Albumin: 3.7 g/dL (ref 3.5–5.0)
Alkaline Phosphatase: 54 U/L (ref 38–126)
Anion gap: 12 (ref 5–15)
BUN: 6 mg/dL (ref 6–20)
CO2: 21 mmol/L — ABNORMAL LOW (ref 22–32)
Calcium: 8.4 mg/dL — ABNORMAL LOW (ref 8.9–10.3)
Chloride: 78 mmol/L — ABNORMAL LOW (ref 98–111)
Creatinine, Ser: 0.9 mg/dL (ref 0.44–1.00)
GFR calc Af Amer: >60 mL/min (ref >60)
GFR calc non Af Amer: >60 mL/min (ref >60)
Glucose, Bld: 75 mg/dL (ref 70–99)
Potassium: 3.5 mmol/L (ref 3.5–5.1)
Sodium: 111 mmol/L — CL (ref 135–145)
Total Bilirubin: 0.5 mg/dL (ref 0.3–1.2)
Total Protein: 6.9 g/dL (ref 6.5–8.1)

## 2018-10-29 LAB — TSH: TSH: 0.09 u[IU]/mL — ABNORMAL LOW (ref 0.350–4.500)

## 2018-10-29 LAB — LIPASE, BLOOD: Lipase: 38 U/L (ref 11–51)

## 2018-10-29 LAB — TROPONIN I: Troponin I: <0.03 ng/mL (ref <0.03)

## 2018-10-29 LAB — MAGNESIUM: Magnesium: 1.5 mg/dL — ABNORMAL LOW (ref 1.7–2.4)

## 2018-10-29 MED ORDER — SODIUM CHLORIDE 0.9 % IV SOLN
Freq: Once | INTRAVENOUS | Status: AC
  Administered 2018-10-29: 23:00:00 1000 mL via INTRAVENOUS

## 2018-10-29 MED ORDER — SODIUM CHLORIDE 0.9 % IV BOLUS (SEPSIS)
1000.0000 mL | Freq: Once | INTRAVENOUS | Status: AC
  Administered 2018-10-29: 19:00:00 1000 mL via INTRAVENOUS

## 2018-10-29 MED ORDER — KETOROLAC TROMETHAMINE 15 MG/ML IJ SOLN
15.0000 mg | Freq: Once | INTRAMUSCULAR | Status: AC
  Administered 2018-10-29: 15 mg via INTRAVENOUS
  Filled 2018-10-29: qty 1

## 2018-10-29 MED ORDER — ONDANSETRON HCL 4 MG/2ML IJ SOLN
4.0000 mg | Freq: Once | INTRAMUSCULAR | Status: AC
  Administered 2018-10-29: 19:00:00 4 mg via INTRAVENOUS
  Filled 2018-10-29: qty 2

## 2018-10-29 NOTE — ED Triage Notes (Signed)
Patient is from home and transported via [facility]. Patient is complaining of nausea, vomiting, body aches primarily in her legs, and headache. Symptoms started 10 days ago. Patient denies shortness of breath and cough. Last vomited greater than 24 hours. She has been taking Ibuprofen for symptoms.

## 2018-10-29 NOTE — ED Notes (Signed)
Bed: IX78 Expected date:  Expected time:  Means of arrival:  Comments: EMS-flu like symptoms

## 2018-10-29 NOTE — ED Notes (Signed)
Admitting doctor at bedside 

## 2018-10-29 NOTE — ED Notes (Signed)
Patient transported to CT via stretcher.

## 2018-10-29 NOTE — ED Notes (Signed)
Patient transported back from CT 

## 2018-10-29 NOTE — ED Provider Notes (Signed)
[place] DEPT Provider Note   CSN: [id number] Arrival date & time: 10/29/18  1813    History   Chief Complaint Chief Complaint  Patient presents with  . Emesis  . Generalized Body Aches  . Headache    HPI Colleen Smith is a 58 y.o. female.     Patient is a 58 year old female with past medical history of depression who presents the emergency department for generalized weakness.  Patient reports that for about 10 days she has had worsening generalized weakness, nausea, vomiting, body aches, headache.  Reports that it started with a headache and nausea vomiting.  Reports that she now has aches in her legs and just feeling weaker.  Reports that she has been trying to drink a lot of Gatorade because she thinks this is some sort of viral illness.  Denies any sick contacts or recent travel.  Denies any dysuria, diarrhea, chest pain, shortness of breath, coughing.  No fevers reported at home.  Reports that today she began to feel dizzy when getting up and standing.  Reports feeling lightheaded like she is in a pass out.     Past Medical History:  Diagnosis Date  . Depression     Patient Active Problem List   Diagnosis Date Noted  . Routine gynecological examination 01/20/2013    Past Surgical History:  Procedure Laterality Date  . APPENDECTOMY       OB History   No obstetric history on file.      Home Medications    Prior to Admission medications   Medication Sig Start Date End Date Taking? Authorizing Provider  buPROPion (WELLBUTRIN XL) 300 MG 24 hr tablet Take 300 mg by mouth daily.    [provider]  sertraline (ZOLOFT) 100 MG tablet Take 150 mg by mouth daily.    [provider]    Family History No family history on file.  Social History Social History   Tobacco Use  . Smoking status: Former Smoker    Packs/day: 0.50    Years: 15.00    Pack years: 7.50    Last attempt to quit: 11/2015    Years since  quitting: 2.9  . Smokeless tobacco: Never Used  Substance Use Topics  . Alcohol use: No    Alcohol/week: 0.0 standard drinks  . Drug use: No     Allergies   Vicodin [hydrocodone-acetaminophen] and Penicillins   Review of Systems Review of Systems  Constitutional: Positive for appetite change and fatigue. Negative for activity change, chills, diaphoresis, fever and unexpected weight change.  HENT: Negative.   Respiratory: Negative for cough and shortness of breath.   Cardiovascular: Negative for chest pain, palpitations and leg swelling.  Gastrointestinal: Positive for nausea and vomiting. Negative for abdominal pain, anal bleeding, blood in stool, constipation and diarrhea.  Genitourinary: Negative for decreased urine volume, difficulty urinating, dysuria, hematuria, vaginal bleeding, vaginal discharge and vaginal pain.  Musculoskeletal: Negative for arthralgias, back pain, neck pain and neck stiffness.  Skin: Negative.   Neurological: Positive for dizziness, weakness (generalized), light-headedness and headaches. Negative for tremors, syncope and numbness.     Physical Exam Updated Vital Signs BP 130/80   Pulse 61   Temp 97.8 F (36.6 C)   Resp 11   Ht 5\' 8"  (1.727 m)   Wt 86.2 kg   LMP 10/09/2014 (Exact Date)   SpO2 100%   BMI 28.89 kg/m   Physical Exam   ED Treatments / Results  Labs (all labs  ordered are listed, but only abnormal results are displayed) Labs Reviewed  URINALYSIS, ROUTINE W REFLEX MICROSCOPIC - Abnormal; Notable for the following components:      Result Value   Color, Urine STRAW (*)    Ketones, ur 20 (*)    Leukocytes,Ua MODERATE (*)    Bacteria, UA RARE (*)    All other components within normal limits  COMPREHENSIVE METABOLIC PANEL - Abnormal; Notable for the following components:   Sodium 111 (*)    Chloride 78 (*)    CO2 21 (*)    Calcium 8.4 (*)    All other components within normal limits  CBC WITH DIFFERENTIAL/PLATELET - Abnormal;  Notable for the following components:   MCHC 36.7 (*)    All other components within normal limits  MAGNESIUM - Abnormal; Notable for the following components:   Magnesium 1.5 (*)    All other components within normal limits  URINE CULTURE  LIPASE, BLOOD  TROPONIN I  TSH    EKG EKG Interpretation  Date/Time:  Tuesday October 29 2018 18:51:45 EDT Ventricular Rate:  109 PR Interval:    QRS Duration: 117 QT Interval:  444 QTC Calculation: 473 R Axis:   21 Text Interpretation:  Sinus rhythm Multi interpolated vent premature complexes Nonspecific intraventricular conduction delay Low voltage, precordial leads Interpretation limited secondary to artifact no prior available for comparison Confirmed by Quintella Reichert [phone]) on 10/29/2018 6:55:44 PM   Radiology Ct Head Wo Contrast  Result Date: 10/29/2018 CLINICAL DATA:  Headache.  "Malignancy suspected". EXAM: CT HEAD WITHOUT CONTRAST TECHNIQUE: Contiguous axial images were obtained from the base of the skull through the vertex without intravenous contrast. COMPARISON:  10/06/2009 FINDINGS: Brain: Cerebral atrophy, age advanced. This results in prominence of the extra-axial spaces, primarily adjacent the frontal lobes. Mild degradation secondary to beam hardening artifact from patient's earrings (patient refused removal). No mass lesion, hemorrhage, hydrocephalus, acute infarct, intra-axial, or extra-axial fluid collection. Vascular: No hyperdense vessel or unexpected calcification. Skull: Normal Sinuses/Orbits: Normal imaged portions of the orbits and globes. Near complete opacification of the left maxillary sinus, new. There is minimal mucosal thickening of left ethmoid air cells. Clear mastoid air cells. Other: None. IMPRESSION: 1.  No acute intracranial abnormality. 2. Mild age advanced cerebral atrophy. 3. Sinus disease. Electronically Signed   By: Abigail Miyamoto M.D.   On: 10/29/2018 21:23   Dg Chest Portable 1 View  Result Date: 10/29/2018  CLINICAL DATA:  Nausea and vomiting EXAM: PORTABLE CHEST 1 VIEW COMPARISON:  None. FINDINGS: The heart size and mediastinal contours are within normal limits. Both lungs are clear. The visualized skeletal structures are unremarkable. IMPRESSION: No active disease. Electronically Signed   By: Ulyses Jarred M.D.   On: 10/29/2018 19:21    Procedures Procedures (including critical care time)  Medications Ordered in ED Medications  sodium chloride 0.9 % bolus 1,000 mL (0 mLs Intravenous Stopped 10/29/18 2142)  ondansetron (ZOFRAN) injection 4 mg (4 mg Intravenous Given 10/29/18 1905)  ketorolac (TORADOL) 15 MG/ML injection 15 mg (15 mg Intravenous Given 10/29/18 1958)     Initial Impression / Assessment and Plan / ED Course  I have reviewed the triage vital signs and the nursing notes.  Pertinent labs & imaging results that were available during my care of the patient were reviewed by me and considered in my medical decision making (see chart for details).  Clinical Course as of Oct 29 2143  Tue Oct 29, 2018  2030 CO2(!): 21 [KM]  Clinical Course User Index [KM] Alveria Apley, PA-C       CRITICAL CARE Performed by: Alveria Apley   Total critical care time: 35 minutes  Critical care time was exclusive of separately billable procedures and treating other patients.  Critical care was necessary to treat or prevent imminent or life-threatening deterioration.  Critical care was time spent personally by me on the following activities: development of treatment plan with patient and/or surrogate as well as nursing, discussions with consultants, evaluation of patient's response to treatment, examination of patient, obtaining history from patient or surrogate, ordering and performing treatments and interventions, ordering and review of laboratory studies, ordering and review of radiographic studies, pulse oximetry and re-evaluation of patient's condition.   Final Clinical Impressions(s)  / ED Diagnoses   Final diagnoses:  Hyponatremia    ED Discharge Orders    None       Kristine Royal 10/29/18 2200    Quintella Reichert, MD 10/29/18 2240    Quintella Reichert, MD 10/29/18 2359

## 2018-10-29 NOTE — ED Notes (Addendum)
Pt. Ambulated down the hall and back to her room on 95% room air without difficulty. Pt.Gait steady on her feet.

## 2018-10-29 NOTE — H&P (Signed)
History and Physical    Colleen Smith DDU:[id number] DOB: [birth year] DOA: 10/29/2018  PCP: Patient, No Pcp Per Patient coming from: Home  Chief Complaint: Generalized weakness, vomiting  HPI: Colleen Smith is a 58 y.o. female with medical history significant of depression presenting to the hospital for evaluation of generalized weakness and vomiting.  Patient reports 3-day history of generalized weakness, nausea, vomiting, and frontal headaches.  Denies any abdominal pain or diarrhea.  States she has not been able to tolerate p.o. intake.  States the muscles in both of her legs have been hurting for the past few days.  Denies focal weakness.  Denies seizures.  She thinks she may have had fevers at home but did not check her temperature.  Denies any suprapubic pain, dysuria, urinary frequency, or urgency.  Denies cough, shortness of breath, or chest pain.  Review of Systems: As per HPI otherwise 10 point review of systems negative.  Past Medical History:  Diagnosis Date  . Depression     Past Surgical History:  Procedure Laterality Date  . APPENDECTOMY       reports that she quit smoking about 2 years ago. She has a 7.50 pack-year smoking history. She has never used smokeless tobacco. She reports that she does not drink alcohol or use drugs.  Allergies  Allergen Reactions  . Vicodin [Hydrocodone-Acetaminophen] Itching  . Penicillins Rash    Did it involve swelling of the face/tongue/throat, SOB, or low BP? Yes Did it involve sudden or severe rash/hives, skin peeling, or any reaction on the inside of your mouth or nose? Yes Did you need to seek medical attention at a hospital or doctor's office? No When did it last happen?Childhood rxn If all above answers are "NO", may proceed with cephalosporin use.    No family history on file.  Prior to Admission medications   Medication Sig Start Date End Date Taking? Authorizing Provider  buPROPion (WELLBUTRIN XL) 300 MG 24 hr tablet  Take 300 mg by mouth daily.    [provider]  sertraline (ZOLOFT) 100 MG tablet Take 150 mg by mouth daily.    [provider]  sertraline (ZOLOFT) 50 MG tablet Take 50 mg by mouth daily. 10/23/18   [provider]    Physical Exam: Vitals:   10/29/18 2359 10/30/18 0000 10/30/18 0030 10/30/18 0045  BP:  128/76 131/68 127/78  Pulse: (!) 57 (!) 59 (!) 54 63  Resp: 12 13 15 15   Temp:      TempSrc:      SpO2: 100% 100% 98% 98%  Weight:      Height:        Physical Exam  Constitutional: She is oriented to person, place, and time. She appears well-developed and well-nourished.  Appears lethargic  HENT:  Head: Normocephalic.  Dry mucous membranes  Eyes: Right eye exhibits no discharge. Left eye exhibits no discharge.  Neck: Neck supple.  Cardiovascular: Normal rate, regular rhythm and intact distal pulses.  Pulmonary/Chest: Effort normal and breath sounds normal. No respiratory distress. She has no wheezes. She has no rales.  Abdominal: Soft. Bowel sounds are normal. She exhibits no distension. There is no abdominal tenderness. There is no rebound and no guarding.  Musculoskeletal:        General: No edema.     Comments: Bilateral lower extremities: Muscles nontender to palpation  Neurological: She is alert and oriented to person, place, and time.  Speech fluent, tongue midline, no facial droop. Strength 5 out of  5 in bilateral upper and lower extremities. Sensation to light touch intact throughout.  Skin: Skin is warm and dry.     Labs on Admission: I have personally reviewed following labs and imaging studies  CBC: Recent Labs  Lab 10/29/18 1841  WBC 6.5  NEUTROABS 2.2  HGB 13.7  HCT 37.3  MCV 80.7  PLT 465   Basic Metabolic Panel: Recent Labs  Lab 10/29/18 1841 10/29/18 2250  NA 111* 111*  K 3.5 3.7  CL 78* 81*  CO2 21* 21*  GLUCOSE 75 72  BUN 6 7  CREATININE 0.90 0.85  CALCIUM 8.4* 7.8*  MG 1.5*  --    GFR: Estimated  Creatinine Clearance: 82.9 mL/min (by C-G formula based on SCr of 0.85 mg/dL). Liver Function Tests: Recent Labs  Lab 10/29/18 1841  AST 40  ALT 24  ALKPHOS 54  BILITOT 0.5  PROT 6.9  ALBUMIN 3.7   Recent Labs  Lab 10/29/18 1841  LIPASE 38   No results for input(s): AMMONIA in the last 168 hours. Coagulation Profile: No results for input(s): INR, PROTIME in the last 168 hours. Cardiac Enzymes: Recent Labs  Lab 10/29/18 1857 10/30/18 0030  CKTOTAL  --  290*  TROPONINI <0.03  --    BNP (last 3 results) No results for input(s): PROBNP in the last 8760 hours. HbA1C: No results for input(s): HGBA1C in the last 72 hours. CBG: No results for input(s): GLUCAP in the last 168 hours. Lipid Profile: No results for input(s): CHOL, HDL, LDLCALC, TRIG, CHOLHDL, LDLDIRECT in the last 72 hours. Thyroid Function Tests: Recent Labs    10/29/18 2139  TSH 0.090*   Anemia Panel: No results for input(s): VITAMINB12, FOLATE, FERRITIN, TIBC, IRON, RETICCTPCT in the last 72 hours. Urine analysis:    Component Value Date/Time   COLORURINE STRAW (A) 10/29/2018 1959   APPEARANCEUR CLEAR 10/29/2018 1959   LABSPEC 1.006 10/29/2018 1959   PHURINE 5.0 10/29/2018 [place] NEGATIVE 10/29/2018 1959   HGBUR NEGATIVE 10/29/2018 [place] NEGATIVE 10/29/2018 1959   KETONESUR 20 (A) 10/29/2018 1959   PROTEINUR NEGATIVE 10/29/2018 1959   NITRITE NEGATIVE 10/29/2018 1959   LEUKOCYTESUR MODERATE (A) 10/29/2018 1959    Radiological Exams on Admission: Ct Head Wo Contrast  Result Date: 10/29/2018 CLINICAL DATA:  Headache.  "Malignancy suspected". EXAM: CT HEAD WITHOUT CONTRAST TECHNIQUE: Contiguous axial images were obtained from the base of the skull through the vertex without intravenous contrast. COMPARISON:  10/06/2009 FINDINGS: Brain: Cerebral atrophy, age advanced. This results in prominence of the extra-axial spaces, primarily adjacent the frontal lobes. Mild degradation  secondary to beam hardening artifact from patient's earrings (patient refused removal). No mass lesion, hemorrhage, hydrocephalus, acute infarct, intra-axial, or extra-axial fluid collection. Vascular: No hyperdense vessel or unexpected calcification. Skull: Normal Sinuses/Orbits: Normal imaged portions of the orbits and globes. Near complete opacification of the left maxillary sinus, new. There is minimal mucosal thickening of left ethmoid air cells. Clear mastoid air cells. Other: None. IMPRESSION: 1.  No acute intracranial abnormality. 2. Mild age advanced cerebral atrophy. 3. Sinus disease. Electronically Signed   By: Abigail Miyamoto M.D.   On: 10/29/2018 21:23   Dg Chest Portable 1 View  Result Date: 10/29/2018 CLINICAL DATA:  Nausea and vomiting EXAM: PORTABLE CHEST 1 VIEW COMPARISON:  None. FINDINGS: The heart size and mediastinal contours are within normal limits. Both lungs are clear. The visualized skeletal structures are unremarkable. IMPRESSION: No active disease. Electronically Signed  By: Ulyses Jarred M.D.   On: 10/29/2018 19:21    EKG: Independently reviewed.  Sinus rhythm, nonspecific T wave abnormality.  Assessment/Plan Principal Problem:   Hyponatremia Active Problems:   Viral gastroenteritis   Hypomagnesemia   Asymptomatic bacteriuria   Myalgia   Severe hypovolemic hyponatremia Likely secondary to GI loss from vomiting and decreased p.o. intake.  Appears dry on exam.  Not on a thiazide diuretic.  Sodium 111, previously normal.  No seizures or altered mental status.  Repeat BMP after 1 L normal saline bolus showing no improvement in sodium.  Suspect secondary to severe hypovolemia.  Discussed with nephrology and recommendations are listed below: -Isotonic fluid repletion (will start NS @150  cc/h) and adjust rate according to repeat labs -BMP every 4 hours -Goal rate of correction 6 to 8 mEq in 24 hours. -Check serum osmolarity -Checks urine sodium and osmolarity -Frequent  neurochecks  Viral gastroenteritis Afebrile and no leukocytosis.  Lipase and LFTs normal.  No complains of abdominal pain.  Abdominal exam benign. -IV Zofran PRN -IV fluid hydration  Hypomagnesemia Magnesium 1.5. -Replete magnesium and continue to monitor  Asymptomatic bacteriuria Afebrile and no leukocytosis.  UA with moderate amount of leukocytes, 11-20 WBCs, rare bacteria, and negative nitrite.  Patient is not endorsing any UTI symptoms. -Urine culture  Abnormal thyroid studies TSH very low at 0.09. -Check free T4 level  Generalized weakness Likely secondary to severe dehydration and severe hyponatremia.  Infectious etiology less likely as afebrile and no leukocytosis.  Chest x-ray not suggestive of pneumonia. -IV fluid hydration -PT evaluation -Thyroid function studies as mentioned above -Check vitamin D level  Myalgia Patient reports soreness of all muscle groups in her bilateral lower extremities.  Not on a statin.  CK 290), verbally confirmed by lab and not on epic yet.  TSH very low at 0.09. -Check phosphorus, free T4, vitamin D, ionized calcium, and a.m. cortisol level  Headaches likely secondary to severe hyponatremia and acute sinusitis Patient reports frontal headaches for the past 3 days.  Neuro exam nonfocal.  Head CT negative for acute intracranial finding.  Does show near complete opacification of the left maxillary sinus, new. -Management of hyponatremia as mentioned above -Afebrile no leukocytosis. Will hold off giving antibiotics. Symptomatic management at this time: Flonase nasal spray.  Depression -Continue Zoloft  DVT prophylaxis: Lovenox Code Status: Full code Family Communication: No family available. Disposition Plan: Anticipate discharge after clinical improvement. Consults called: Nephrology (Dr. Jimmy Footman) Admission status: It is my clinical opinion that admission to INPATIENT is reasonable and necessary in this 58 y.o. female . presenting with  severe hyponatremia and viral gastroenteritis . Workup and treatment include IV fluid hydration and monitoring BMP every 4 hours.  Given the aforementioned, the predictability of an adverse outcome is felt to be significant. I expect that the patient will require at least 2 midnights in the hospital to treat this condition.   This chart was dictated using voice recognition software.  Despite best efforts to proofread, errors can occur which can change the documentation meaning.  Shela Leff MD [facility] Pager [phone]  If 7PM-7AM, please contact night-coverage [email or web address] Password [email or web address]  10/30/2018, 1:25 AM

## 2018-10-29 NOTE — ED Notes (Signed)
ED Provider at bedside. 

## 2018-10-29 NOTE — ED Notes (Signed)
Patient can eat and drink per PA.

## 2018-10-29 NOTE — ED Notes (Signed)
XR at bedside

## 2018-10-29 NOTE — ED Notes (Signed)
Sodium 111 per lab

## 2018-10-30 ENCOUNTER — Encounter (HOSPITAL_COMMUNITY): Payer: Self-pay | Admitting: Nephrology

## 2018-10-30 ENCOUNTER — Other Ambulatory Visit: Payer: Self-pay

## 2018-10-30 DIAGNOSIS — R8271 Bacteriuria: Secondary | ICD-10-CM

## 2018-10-30 DIAGNOSIS — M791 Myalgia, unspecified site: Secondary | ICD-10-CM

## 2018-10-30 DIAGNOSIS — A084 Viral intestinal infection, unspecified: Secondary | ICD-10-CM

## 2018-10-30 LAB — GLUCOSE, CAPILLARY
Glucose-Capillary: 78 mg/dL (ref 70–99)
Glucose-Capillary: 87 mg/dL (ref 70–99)

## 2018-10-30 LAB — SODIUM
Sodium: 113 mmol/L — CL (ref 135–145)
Sodium: 115 mmol/L — CL (ref 135–145)
Sodium: 117 mmol/L — CL (ref 135–145)

## 2018-10-30 LAB — BASIC METABOLIC PANEL
Anion gap: 11 (ref 5–15)
BUN: <5 mg/dL — ABNORMAL LOW (ref 6–20)
CO2: 18 mmol/L — ABNORMAL LOW (ref 22–32)
Calcium: 7.6 mg/dL — ABNORMAL LOW (ref 8.9–10.3)
Chloride: 82 mmol/L — ABNORMAL LOW (ref 98–111)
Creatinine, Ser: 0.92 mg/dL (ref 0.44–1.00)
GFR calc Af Amer: >60 mL/min (ref >60)
GFR calc non Af Amer: >60 mL/min (ref >60)
Glucose, Bld: 56 mg/dL — ABNORMAL LOW (ref 70–99)
Potassium: 5.1 mmol/L (ref 3.5–5.1)
Sodium: 111 mmol/L — CL (ref 135–145)

## 2018-10-30 LAB — MAGNESIUM: Magnesium: 2 mg/dL (ref 1.7–2.4)

## 2018-10-30 LAB — MRSA PCR SCREENING: MRSA by PCR: NEGATIVE

## 2018-10-30 LAB — OSMOLALITY, URINE: Osmolality, Ur: 251 mOsm/kg — ABNORMAL LOW (ref 300–900)

## 2018-10-30 LAB — SODIUM, URINE, RANDOM: Sodium, Ur: 46 mmol/L

## 2018-10-30 LAB — URINE CULTURE: Culture: NO GROWTH

## 2018-10-30 LAB — T4, FREE: Free T4: 0.35 ng/dL — ABNORMAL LOW (ref 0.82–1.77)

## 2018-10-30 LAB — PHOSPHORUS: Phosphorus: 2.7 mg/dL (ref 2.5–4.6)

## 2018-10-30 LAB — CK: Total CK: 290 U/L — ABNORMAL HIGH (ref 38–234)

## 2018-10-30 LAB — CORTISOL-AM, BLOOD: Cortisol - AM: <0.4 ug/dL — ABNORMAL LOW (ref 6.7–22.6)

## 2018-10-30 MED ORDER — MAGNESIUM SULFATE 2 GM/50ML IV SOLN
2.0000 g | Freq: Once | INTRAVENOUS | Status: AC
  Administered 2018-10-30: 2 g via INTRAVENOUS
  Filled 2018-10-30: qty 50

## 2018-10-30 MED ORDER — ACETAMINOPHEN 650 MG RE SUPP: 650.0000 mg | Freq: Four times a day (QID) | RECTAL | Status: DC | PRN

## 2018-10-30 MED ORDER — MORPHINE SULFATE (PF) 2 MG/ML IV SOLN
1.0000 mg | INTRAVENOUS | Status: DC | PRN
  Administered 2018-10-30 – 2018-11-02 (×10): 1 mg via INTRAVENOUS
  Filled 2018-10-30 (×10): qty 1

## 2018-10-30 MED ORDER — SODIUM CHLORIDE 0.9 % IV SOLN
INTRAVENOUS | Status: DC
  Administered 2018-10-30 (×2): via INTRAVENOUS

## 2018-10-30 MED ORDER — ONDANSETRON HCL 4 MG/2ML IJ SOLN
4.0000 mg | Freq: Four times a day (QID) | INTRAMUSCULAR | Status: DC | PRN
  Administered 2018-10-30: 22:00:00 4 mg via INTRAVENOUS
  Filled 2018-10-30: qty 2

## 2018-10-30 MED ORDER — SODIUM CHLORIDE 3 % IV SOLN
INTRAVENOUS | Status: DC
  Administered 2018-10-30 – 2018-11-01 (×3): 26 mL/h via INTRAVENOUS
  Filled 2018-10-30 (×3): qty 500

## 2018-10-30 MED ORDER — ENOXAPARIN SODIUM 40 MG/0.4ML ~~LOC~~ SOLN
40.0000 mg | Freq: Every day | SUBCUTANEOUS | Status: DC
  Administered 2018-10-30 – 2018-11-04 (×6): 40 mg via SUBCUTANEOUS
  Filled 2018-10-30 (×6): qty 0.4

## 2018-10-30 MED ORDER — ACETAMINOPHEN 325 MG PO TABS
650.0000 mg | ORAL_TABLET | Freq: Four times a day (QID) | ORAL | Status: DC | PRN
  Administered 2018-10-30 – 2018-11-04 (×11): 650 mg via ORAL
  Filled 2018-10-30 (×10): qty 2

## 2018-10-30 MED ORDER — CHLORHEXIDINE GLUCONATE CLOTH 2 % EX PADS
6.0000 | MEDICATED_PAD | Freq: Every day | CUTANEOUS | Status: DC
  Administered 2018-10-31 – 2018-11-02 (×3): 6 via TOPICAL

## 2018-10-30 MED ORDER — FLUTICASONE PROPIONATE 50 MCG/ACT NA SUSP
1.0000 | Freq: Every day | NASAL | Status: DC
  Administered 2018-10-30 – 2018-11-04 (×6): 1 via NASAL
  Filled 2018-10-30 (×2): qty 16

## 2018-10-30 MED ORDER — SERTRALINE HCL 50 MG PO TABS: 50.0000 mg | ORAL_TABLET | Freq: Every day | ORAL | Status: DC

## 2018-10-30 MED ORDER — CHLORHEXIDINE GLUCONATE CLOTH 2 % EX PADS
6.0000 | MEDICATED_PAD | Freq: Every day | CUTANEOUS | Status: DC
  Administered 2018-10-30: 20:00:00 6 via TOPICAL

## 2018-10-30 NOTE — Consult Note (Signed)
Renal Service Consult Note [facility]  Colleen Smith 10/30/2018 [place] Requesting Physician:  Dr Ree Kida   Reason for Consult:  Hyponatremia HPI: The patient is a 58 y.o. year-old with hx of depression came to ED yest pm with c/o N/V, headache, dizzy. Has been taking nsaids . No fever or cough. Labs showed Na+ 111.  Pt admitted and rec'd IV NS overnight, Na+ still low this am, asked to see for hyponatremia.   Pt vague historian, some N/V but only for 1-2 days, no diarrhea, no hx liver/ kidney/ heart disease. No edema. No fevers.  Has been on zoloft for years.  No new diuretics. Not on BP medications. No hx lung or brain disease or cancer or thyroid disease.     ROS  denies CP  no joint pain   no HA  no blurry vision  no rash  no diarrhea  no nausea/ vomiting  no dysuria  no difficulty voiding  no change in urine color    Past Medical History  Past Medical History:  Diagnosis Date  . Depression    Past Surgical History  Past Surgical History:  Procedure Laterality Date  . APPENDECTOMY     Family History No family history on file. Social History  reports that she quit smoking about 2 years ago. She has a 7.50 pack-year smoking history. She has never used smokeless tobacco. She reports that she does not drink alcohol or use drugs. Allergies  Allergies  Allergen Reactions  . Vicodin [Hydrocodone-Acetaminophen] Itching  . Penicillins Rash    Did it involve swelling of the face/tongue/throat, SOB, or low BP? Yes Did it involve sudden or severe rash/hives, skin peeling, or any reaction on the inside of your mouth or nose? Yes Did you need to seek medical attention at a hospital or doctor's office? No When did it last happen?Childhood rxn If all above answers are "NO", may proceed with cephalosporin use.   Home medications Prior to Admission medications   Medication Sig Start Date End Date Taking? Authorizing Provider  ALPRAZolam (XANAX)  0.25 MG tablet Take 0.25 mg by mouth 3 (three) times daily as needed for anxiety.  07/15/18  Yes [provider]  buPROPion (WELLBUTRIN XL) 300 MG 24 hr tablet Take 300 mg by mouth daily.   Yes [provider]  sertraline (ZOLOFT) 50 MG tablet Take 50 mg by mouth daily. 10/23/18  Yes [provider]   Liver Function Tests Recent Labs  Lab 10/29/18 1841  AST 40  ALT 24  ALKPHOS 54  BILITOT 0.5  PROT 6.9  ALBUMIN 3.7   Recent Labs  Lab 10/29/18 1841  LIPASE 38   CBC Recent Labs  Lab 10/29/18 1841  WBC 6.5  NEUTROABS 2.2  HGB 13.7  HCT 37.3  MCV 80.7  PLT 222   Basic Metabolic Panel Recent Labs  Lab 10/29/18 1841 10/29/18 2250 10/30/18 0415  NA 111* 111* 111*  K 3.5 3.7 5.1  CL 78* 81* 82*  CO2 21* 21* 18*  GLUCOSE 75 72 56*  BUN 6 7 <5*  CREATININE 0.90 0.85 0.92  CALCIUM 8.4* 7.8* 7.6*  PHOS  --   --  2.7   Iron/TIBC/Ferritin/ %Sat No results found for: IRON, TIBC, FERRITIN, IRONPCTSAT  Vitals:   10/30/18 0400 10/30/18 0500 10/30/18 0600 10/30/18 0800  BP: 136/80 135/80 (!) 132/98 120/84  Pulse: (!) 57 (!) 56 69 (!) 58  Resp: 18 17 18 13   Temp: 97.8 F (  36.6 C)   97.8 F (36.6 C)  TempSrc: Oral   Oral  SpO2: 100% 97% 100%   Weight:      Height:       Exam Gen alert, lyilng flat, no distress, calm and pleasant No rash, cyanosis or gangrene Sclera anicteric, throat clear  No jvd or bruits Chest clear bilat RRR no MRG Abd soft ntnd no mass or ascites +bs GU defer MS no joint effusions or deformity Ext no LE or UE edema, no wounds or ulcers Neuro is alert, Ox 3 , nf    Home meds:  - alprzaolam 0.25 tid prn/ sertraline 50 qd/ bupropion 300 qd     Assessment: 1. Hyponatremia - in euvolemic patient w/o evidence of edematous state.  TSH is low, but assoc w/ thyroid and hyponatremia is hypothyroidism.  Check am cortisol.  DC SSRI.  Prob should dc bupropion as well, there are a few reports of this medicine and  hyponatremia.  Will need 3% saline, will order.  CT brain and CXR are negative.  2. Depression     Plan: 1. As above.       [facility] 10/30/2018, 8:08 AM

## 2018-10-30 NOTE — Progress Notes (Signed)
0045 Patient received via stretcher to room 1236. Patient alert and oriented x4 and able to fully participate in admission assessment and history. Complained of moderate leg pain bilaterally.

## 2018-10-30 NOTE — Progress Notes (Signed)
PROGRESS NOTE    Providence Stivers  ZOX:[id number] DOB: [birth year] DOA: 10/29/2018 PCP: Patient, No Pcp Per   Brief Narrative:  HPI ON 10/29/2018 by Dr. Thomes Lolling Colleen Smith is a 58 y.o. female with medical history significant of depression presenting to the hospital for evaluation of generalized weakness and vomiting.  Patient reports 3-day history of generalized weakness, nausea, vomiting, and frontal headaches.  Denies any abdominal pain or diarrhea.  States she has not been able to tolerate p.o. intake.  States the muscles in both of her legs have been hurting for the past few days.  Denies focal weakness.  Denies seizures.  She thinks she may have had fevers at home but did not check her temperature.  Denies any suprapubic pain, dysuria, urinary frequency, or urgency.  Denies cough, shortness of breath, or chest pain.  Assessment & Plan   Symptomatic severe hyponatremia -Presented with generalized weakness, dizziness and blurry vision -CT head unremarkable, chest x-ray unremarkable for infection -TSH <0.09, obtain free T4 -Discontinue Zoloft as this can also lead to hyponatremia (although patient has been on this for several years) -Patient currently appears to be euvolemic -AM cortisol pending -Nephrology consulted and appreciated.  Discussed with Dr. Burnett Sheng, will place on 3% saline -urine sodium 46, urine osm 251 -Continue to monitor BMP  Viral gastroenteritis -Currently Afebrile and has no leukocytosis -Lipase and LFTs within normal limits -Complains of abdominal pain -Abdominal exam benign  Hypomagnesemia -resolved with repletion   Asymptomatic bacteriuria -Currently, patient afebrile and no leukocytosis.   -UA showed moderate leukocytes, 11-20 WBCs, rare bacteria, and negative nitrite.  -denies any dysuria  -Urine culture pending   Abnormal thyroid studies -TSH very low at 0.09. -FT4 0.35 -Cortisol <0.4 -? Secondary to tertiary causes -will obtain ACTH  stim test  Generalized weakness -likely secondary to hyponatremia vs underlying thyroid issue -currently no source of infection  -Continue hydration -PT pending -Vitamin D level pending  Myalgia Patient reports soreness of all muscle groups in her bilateral lower extremities.  Not on a statin.   -CK 290 -TSH very low at 0.09. -work up pending   Headaches likely secondary to severe hyponatremia and acute sinusitis -headaches for the past 3 days, frontal -Neuro exam nonfocal -Head CT unremarkable for acute intracranial finding. Near pleat opacification of the left maxillary sinus -Continue pain control, Flonase, and treatment of hyponatremia as above  Depression -Continue Zoloft  DVT Prophylaxis  lovenox  Code Status: Full  Family Communication: None at bedside  Disposition Plan: Admitted. Dispo pending improvement in sodium levels.   Consultants Nephrology  Procedures  None  Antibiotics   Anti-infectives (From admission, onward)   None      Subjective:   Colleen Smith seen and examined today.  Continues to feel very weak and have leg pain.  Denies current chest pain, shortness of breath, abdominal pain, nausea or vomiting, diarrhea or constipation, dizziness.  Patient has had headache for approximately 3 days now.  She does state that she had some nausea prior to admission however no further episodes.  Objective:   Vitals:   10/30/18 0500 10/30/18 0600 10/30/18 0800 10/30/18 1058  BP: 135/80 (!) 132/98 120/84   Pulse: (!) 56 69 (!) 58 (!) 59  Resp: 17 18 13 14   Temp:   97.8 F (36.6 C)   TempSrc:   Oral   SpO2: 97% 100%  100%  Weight:      Height:        Intake/Output Summary (  Last 24 hours) at 10/30/2018 1145 Last data filed at 10/30/2018 1000 Gross per 24 hour  Intake 2433.5 ml  Output 500 ml  Net 1933.5 ml   Filed Weights   10/29/18 1831  Weight: 86.2 kg    Exam  General: Well developed, well nourished, NAD, appears stated age  10:  NCAT,mucous membranes moist.   Neck: Supple  Cardiovascular: S1 S2 auscultated, RRR, no murmur  Respiratory: Clear to auscultation bilaterally with equal chest rise  Abdomen: Soft, nontender, nondistended, + bowel sounds  Extremities: warm dry without cyanosis clubbing or edema  Neuro: AAOx3, nonfocal  Psych: Appropriate mood and affect   Data Reviewed: I have personally reviewed following labs and imaging studies  CBC: Recent Labs  Lab 10/29/18 1841  WBC 6.5  NEUTROABS 2.2  HGB 13.7  HCT 37.3  MCV 80.7  PLT 025   Basic Metabolic Panel: Recent Labs  Lab 10/29/18 1841 10/29/18 2250 10/30/18 0415  NA 111* 111* 111*  K 3.5 3.7 5.1  CL 78* 81* 82*  CO2 21* 21* 18*  GLUCOSE 75 72 56*  BUN 6 7 <5*  CREATININE 0.90 0.85 0.92  CALCIUM 8.4* 7.8* 7.6*  MG 1.5*  --  2.0  PHOS  --   --  2.7   GFR: Estimated Creatinine Clearance: 76.6 mL/min (by C-G formula based on SCr of 0.92 mg/dL). Liver Function Tests: Recent Labs  Lab 10/29/18 1841  AST 40  ALT 24  ALKPHOS 54  BILITOT 0.5  PROT 6.9  ALBUMIN 3.7   Recent Labs  Lab 10/29/18 1841  LIPASE 38   No results for input(s): AMMONIA in the last 168 hours. Coagulation Profile: No results for input(s): INR, PROTIME in the last 168 hours. Cardiac Enzymes: Recent Labs  Lab 10/29/18 1857 10/30/18 0030  CKTOTAL  --  290*  TROPONINI <0.03  --    BNP (last 3 results) No results for input(s): PROBNP in the last 8760 hours. HbA1C: No results for input(s): HGBA1C in the last 72 hours. CBG: Recent Labs  Lab 10/30/18 0606 10/30/18 0838  GLUCAP 78 87   Lipid Profile: No results for input(s): CHOL, HDL, LDLCALC, TRIG, CHOLHDL, LDLDIRECT in the last 72 hours. Thyroid Function Tests: Recent Labs    10/29/18 2139 10/30/18 0415  TSH 0.090*  --   FREET4  --  0.35*   Anemia Panel: No results for input(s): VITAMINB12, FOLATE, FERRITIN, TIBC, IRON, RETICCTPCT in the last 72 hours. Urine analysis:      Component Value Date/Time   COLORURINE STRAW (A) 10/29/2018 1959   APPEARANCEUR CLEAR 10/29/2018 1959   LABSPEC 1.006 10/29/2018 1959   PHURINE 5.0 10/29/2018 [place] NEGATIVE 10/29/2018 1959   HGBUR NEGATIVE 10/29/2018 [place] NEGATIVE 10/29/2018 1959   KETONESUR 20 (A) 10/29/2018 [place] NEGATIVE 10/29/2018 1959   NITRITE NEGATIVE 10/29/2018 1959   LEUKOCYTESUR MODERATE (A) 10/29/2018 1959   Sepsis Labs: @LABRCNTIP (procalcitonin:4,lacticidven:4)  ) Recent Results (from the past 240 hour(s))  MRSA PCR Screening     Status: None   Collection Time: 10/30/18  3:10 AM  Result Value Ref Range Status   MRSA by PCR NEGATIVE NEGATIVE Final    Comment:        The GeneXpert MRSA Assay (FDA approved for NASAL specimens only), is one component of a comprehensive MRSA colonization surveillance program. It is not intended to diagnose MRSA infection nor to guide or monitor treatment for MRSA infections. Performed at [facility], [place] [address], [place], [place] [address]       Radiology Studies: Ct Head Wo Contrast  Result Date: 10/29/2018 CLINICAL DATA:  Headache.  "Malignancy suspected". EXAM: CT HEAD WITHOUT CONTRAST TECHNIQUE: Contiguous axial images were obtained from the base of the skull through the vertex without intravenous contrast. COMPARISON:  10/06/2009 FINDINGS: Brain: Cerebral atrophy, age advanced. This results in prominence of the extra-axial spaces, primarily adjacent the frontal lobes. Mild degradation secondary to beam hardening artifact from patient's earrings (patient refused removal). No mass lesion, hemorrhage, hydrocephalus, acute infarct, intra-axial, or extra-axial fluid collection. Vascular: No hyperdense vessel or unexpected calcification. Skull: Normal Sinuses/Orbits: Normal imaged portions of the orbits and globes. Near complete opacification of the left maxillary sinus, new. There is minimal mucosal thickening  of left ethmoid air cells. Clear mastoid air cells. Other: None. IMPRESSION: 1.  No acute intracranial abnormality. 2. Mild age advanced cerebral atrophy. 3. Sinus disease. Electronically Signed   By: Abigail Miyamoto M.D.   On: 10/29/2018 21:23   Dg Chest Portable 1 View  Result Date: 10/29/2018 CLINICAL DATA:  Nausea and vomiting EXAM: PORTABLE CHEST 1 VIEW COMPARISON:  None. FINDINGS: The heart size and mediastinal contours are within normal limits. Both lungs are clear. The visualized skeletal structures are unremarkable. IMPRESSION: No active disease. Electronically Signed   By: Ulyses Jarred M.D.   On: 10/29/2018 19:21     Scheduled Meds:  enoxaparin (LOVENOX) injection  40 mg Subcutaneous Daily   fluticasone  1 spray Each Nare Daily   Continuous Infusions:  sodium chloride (hypertonic) 26 mL/hr (10/30/18 0842)     LOS: 1 day   Time Spent in minutes   45 minutes  Quiera Diffee D.O. on 10/30/2018 at 11:45 AM  Between 7am to 7pm - Please see pager noted on [email or web address]  After 7pm go to [email or web address]  And look for the night coverage person covering for me after hours  [facility] Office  [phone]

## 2018-10-30 NOTE — ED Notes (Signed)
ED TO INPATIENT HANDOFF REPORT   Name/Age/Gender Colleen Smith 58 y.o. female Room/Bed: WA18/WA18  Code Status   Code Status: Full Code  Home/SNF/Other Home Patient oriented to: self, place, time and situation Is this baseline? Yes   Triage Complete: Triage complete  Chief Complaint Flu Like Symptoms  Triage Note Patient is from home and transported via [facility]. Patient is complaining of nausea, vomiting, body aches primarily in her legs, and headache. Symptoms started 10 days ago. Patient denies shortness of breath and cough. Last vomited greater than 24 hours. She has been taking Ibuprofen for symptoms.    Allergies Allergies  Allergen Reactions  . Vicodin [Hydrocodone-Acetaminophen] Itching  . Penicillins Rash    Did it involve swelling of the face/tongue/throat, SOB, or low BP? Yes Did it involve sudden or severe rash/hives, skin peeling, or any reaction on the inside of your mouth or nose? Yes Did you need to seek medical attention at a hospital or doctor's office? No When did it last happen?Childhood rxn If all above answers are "NO", may proceed with cephalosporin use.    Level of Care/Admitting Diagnosis ED Disposition    ED Disposition Condition Comment   Admit  Hospital Area: [facility] [[id number]]  Level of Care: Stepdown [14]  Admit to SDU based on following criteria: Severe physiological/psychological symptoms:  Any diagnosis requiring assessment & intervention at least every 4 hours on an ongoing basis to obtain desired patient outcomes including stability and rehabilitation  Diagnosis: Hyponatremia [198519]  Admitting Physician: Shela Leff [[id number]]  Attending Physician: Shela Leff [[id number]]  Estimated length of stay: past midnight tomorrow  Certification:: I certify this patient will need inpatient services for at least 2 midnights  Possible Covid Disease Patient Isolation: N/A  PT Class (Do Not Modify):  Inpatient [101]  PT Acc Code (Do Not Modify): Private [1]       B Medical/Surgery History Past Medical History:  Diagnosis Date  . Depression    Past Surgical History:  Procedure Laterality Date  . APPENDECTOMY       A IV Location/Drains/Wounds Patient Lines/Drains/Airways Status   Active Line/Drains/Airways    Name:   Placement date:   Placement time:   Site:   Days:   Peripheral IV 10/29/18 Left Antecubital   10/29/18    1904    Antecubital   1          Intake/Output Last 24 hours  Intake/Output Summary (Last 24 hours) at 10/30/2018 0037 Last data filed at 10/29/2018 2142 Gross per 24 hour  Intake 1000 ml  Output -  Net 1000 ml    Labs/Imaging Results for orders placed or performed during the hospital encounter of 10/29/18 (from the past 48 hour(s))  Comprehensive metabolic panel     Status: Abnormal   Collection Time: 10/29/18  6:41 PM  Result Value Ref Range   Sodium 111 (LL) 135 - 145 mmol/L    Comment: CRITICAL RESULT CALLED TO, READ BACK BY AND VERIFIED WITH: Marcy Panning RN 2027 10/29/18 A NAVARRO REPEATED TO VERIFY    Potassium 3.5 3.5 - 5.1 mmol/L   Chloride 78 (L) 98 - 111 mmol/L   CO2 21 (L) 22 - 32 mmol/L   Glucose, Bld 75 70 - 99 mg/dL   BUN 6 6 - 20 mg/dL   Creatinine, Ser 0.90 0.44 - 1.00 mg/dL   Calcium 8.4 (L) 8.9 - 10.3 mg/dL   Total Protein 6.9 6.5 - 8.1 g/dL   Albumin  3.7 3.5 - 5.0 g/dL   AST 40 15 - 41 U/L   ALT 24 0 - 44 U/L   Alkaline Phosphatase 54 38 - 126 U/L   Total Bilirubin 0.5 0.3 - 1.2 mg/dL   GFR calc non Af Amer >60 >60 mL/min   GFR calc Af Amer >60 >60 mL/min   Anion gap 12 5 - 15    Comment: Performed at [facility], [place] [address] [place], [place] [address]  Lipase, blood     Status: None   Collection Time: 10/29/18  6:41 PM  Result Value Ref Range   Lipase 38 11 - 51 U/L    Comment: Performed at [facility], [place] [address] [place], [place] [address]  CBC with Differential      Status: Abnormal   Collection Time: 10/29/18  6:41 PM  Result Value Ref Range   WBC 6.5 4.0 - 10.5 K/uL   RBC 4.62 3.87 - 5.11 MIL/uL   Hemoglobin 13.7 12.0 - 15.0 g/dL   HCT 37.3 36.0 - 46.0 %   MCV 80.7 80.0 - 100.0 fL   MCH 29.7 26.0 - 34.0 pg   MCHC 36.7 (H) 30.0 - 36.0 g/dL   RDW 11.8 11.5 - 15.5 %   Platelets 287 150 - 400 K/uL   nRBC 0.0 0.0 - 0.2 %   Neutrophils Relative % 34 %   Neutro Abs 2.2 1.7 - 7.7 K/uL   Lymphocytes Relative 57 %   Lymphs Abs 3.7 0.7 - 4.0 K/uL   Monocytes Relative 5 %   Monocytes Absolute 0.4 0.1 - 1.0 K/uL   Eosinophils Relative 3 %   Eosinophils Absolute 0.2 0.0 - 0.5 K/uL   Basophils Relative 1 %   Basophils Absolute 0.1 0.0 - 0.1 K/uL   Immature Granulocytes 0 %   Abs Immature Granulocytes 0.01 0.00 - 0.07 K/uL    Comment: Performed at [facility], [place] [address] [place], [place] [address]  Magnesium     Status: Abnormal   Collection Time: 10/29/18  6:41 PM  Result Value Ref Range   Magnesium 1.5 (L) 1.7 - 2.4 mg/dL    Comment: Performed at [facility], [place] [address] [place], [place] [address]  Troponin I - ONCE - STAT     Status: None   Collection Time: 10/29/18  6:57 PM  Result Value Ref Range   Troponin I <0.03 <0.03 ng/mL    Comment: Performed at [facility], [place] [address] [place], [place] [address]  Urinalysis, Routine w reflex microscopic     Status: Abnormal   Collection Time: 10/29/18  7:59 PM  Result Value Ref Range   Color, Urine STRAW (A) YELLOW   APPearance CLEAR CLEAR   Specific Gravity, Urine 1.006 1.005 - 1.030   pH 5.0 5.0 - 8.0   Glucose, UA NEGATIVE NEGATIVE mg/dL   Hgb urine dipstick NEGATIVE NEGATIVE   Bilirubin Urine NEGATIVE NEGATIVE   Ketones, ur 20 (A) NEGATIVE mg/dL   Protein, ur NEGATIVE NEGATIVE mg/dL   Nitrite NEGATIVE NEGATIVE   Leukocytes,Ua MODERATE (A) NEGATIVE   RBC / HPF 0-5 0 - 5 RBC/hpf   WBC, UA 11-20 0 - 5 WBC/hpf    Bacteria, UA RARE (A) NONE SEEN   Mucus PRESENT     Comment: Performed at [facility], [place] [address] [place], [place] [address]  TSH     Status: Abnormal  Collection Time: 10/29/18  9:39 PM  Result Value Ref Range   TSH 0.090 (L) 0.350 - 4.500 uIU/mL    Comment: Performed by a 3rd Generation assay with a functional sensitivity of <=0.01 uIU/mL. Performed at [facility], [place] [address] [place], [place] [address]   Basic metabolic panel     Status: Abnormal   Collection Time: 10/29/18 10:50 PM  Result Value Ref Range   Sodium 111 (LL) 135 - 145 mmol/L    Comment: CRITICAL RESULT CALLED TO, READ BACK BY AND VERIFIED WITH: Marcy Panning RN [id number] 10/29/18 A NAVARRO    Potassium 3.7 3.5 - 5.1 mmol/L   Chloride 81 (L) 98 - 111 mmol/L   CO2 21 (L) 22 - 32 mmol/L   Glucose, Bld 72 70 - 99 mg/dL   BUN 7 6 - 20 mg/dL   Creatinine, Ser 0.85 0.44 - 1.00 mg/dL   Calcium 7.8 (L) 8.9 - 10.3 mg/dL   GFR calc non Af Amer >60 >60 mL/min   GFR calc Af Amer >60 >60 mL/min   Anion gap 9 5 - 15    Comment: Performed at [facility], [place] [address], [place], [place] [address]  Sodium, urine, random     Status: None   Collection Time: 10/29/18 11:06 PM  Result Value Ref Range   Sodium, Ur 46 mmol/L    Comment: Performed at [facility], [place] [address], [place], [place] [address]   Ct Head Wo Contrast  Result Date: 10/29/2018 CLINICAL DATA:  Headache.  "Malignancy suspected". EXAM: CT HEAD WITHOUT CONTRAST TECHNIQUE: Contiguous axial images were obtained from the base of the skull through the vertex without intravenous contrast. COMPARISON:  10/06/2009 FINDINGS: Brain: Cerebral atrophy, age advanced. This results in prominence of the extra-axial spaces, primarily adjacent the frontal lobes. Mild degradation secondary to beam hardening artifact from patient's earrings (patient refused removal). No mass lesion, hemorrhage,  hydrocephalus, acute infarct, intra-axial, or extra-axial fluid collection. Vascular: No hyperdense vessel or unexpected calcification. Skull: Normal Sinuses/Orbits: Normal imaged portions of the orbits and globes. Near complete opacification of the left maxillary sinus, new. There is minimal mucosal thickening of left ethmoid air cells. Clear mastoid air cells. Other: None. IMPRESSION: 1.  No acute intracranial abnormality. 2. Mild age advanced cerebral atrophy. 3. Sinus disease. Electronically Signed   By: Abigail Miyamoto M.D.   On: 10/29/2018 21:23   Dg Chest Portable 1 View  Result Date: 10/29/2018 CLINICAL DATA:  Nausea and vomiting EXAM: PORTABLE CHEST 1 VIEW COMPARISON:  None. FINDINGS: The heart size and mediastinal contours are within normal limits. Both lungs are clear. The visualized skeletal structures are unremarkable. IMPRESSION: No active disease. Electronically Signed   By: Ulyses Jarred M.D.   On: 10/29/2018 19:21    Pending Labs Unresulted Labs (From admission, onward)    Start     Ordered   10/30/18 0175  Basic metabolic panel  Now then every 4 hours,   R     10/30/18 0017   10/30/18 0016  CK  Once,   R     10/30/18 0017   10/29/18 2307  Osmolality, urine  Once,   R     10/29/18 2306   10/29/18 2143  Urine culture  Add-on,   STAT     10/29/18 2142          Vitals/Pain Today's Vitals   10/29/18 2359 10/30/18 0000 10/30/18 0030 10/30/18 0031  BP:  128/76  131/68   Pulse: (!) 57 (!) 59 (!) 54   Resp: 12 13 15    Temp:      TempSrc:      SpO2: 100% 100% 98%   Weight:      Height:      PainSc:    8     Isolation Precautions No active isolations  Medications Medications  sertraline (ZOLOFT) tablet 50 mg (has no administration in time range)  enoxaparin (LOVENOX) injection 40 mg (has no administration in time range)  acetaminophen (TYLENOL) tablet 650 mg (has no administration in time range)    Or  acetaminophen (TYLENOL) suppository 650 mg (has no administration  in time range)  fluticasone (FLONASE) 50 MCG/ACT nasal spray 1 spray (has no administration in time range)  ondansetron (ZOFRAN) injection 4 mg (has no administration in time range)  magnesium sulfate IVPB 2 g 50 mL (2 g Intravenous New Bag/Given 10/30/18 0028)  0.9 %  sodium chloride infusion ( Intravenous New Bag/Given 10/30/18 0028)  sodium chloride 0.9 % bolus 1,000 mL (0 mLs Intravenous Stopped 10/29/18 2142)  ondansetron (ZOFRAN) injection 4 mg (4 mg Intravenous Given 10/29/18 1905)  ketorolac (TORADOL) 15 MG/ML injection 15 mg (15 mg Intravenous Given 10/29/18 1958)  0.9 %  sodium chloride infusion (0 mLs Intravenous Stopped 10/29/18 2327)    Mobility walks Low fall risk

## 2018-10-30 NOTE — Progress Notes (Signed)
CRITICAL VALUE ALERT  Critical Value:  114  Date & Time Notied:  1548 4/15  Provider Notified: Dr. Ree Kida  Orders Received/Actions taken: Continue on 3% saline

## 2018-10-30 NOTE — Progress Notes (Signed)
CRITICAL VALUE ALERT  Critical Value:  113 Na  Date & Time Notied:  12:11 4/15  Provider Notified: Dr. Ree Kida  Orders Received/Actions taken: Currently receiving 3% Saline at 26 ml/hr.

## 2018-10-30 NOTE — ED Notes (Signed)
Report given to Christy, RN

## 2018-10-30 NOTE — ED Notes (Signed)
Attempted to call report. On hold 6 minutes, disconnected call.

## 2018-10-30 NOTE — ED Notes (Signed)
Patient ambulated to the restroom without assistance. Patient did complain of being dizzy upon walking and standing, but no distress noted at this time.

## 2018-10-31 LAB — CBC
HCT: 37.1 % (ref 36.0–46.0)
Hemoglobin: 13.2 g/dL (ref 12.0–15.0)
MCH: 29.7 pg (ref 26.0–34.0)
MCHC: 35.6 g/dL (ref 30.0–36.0)
MCV: 83.4 fL (ref 80.0–100.0)
Platelets: 297 10*3/uL (ref 150–400)
RBC: 4.45 MIL/uL (ref 3.87–5.11)
RDW: 12.2 % (ref 11.5–15.5)
WBC: 5 10*3/uL (ref 4.0–10.5)
nRBC: 0 % (ref 0.0–0.2)

## 2018-10-31 LAB — BASIC METABOLIC PANEL
Anion gap: 8 (ref 5–15)
BUN: <5 mg/dL — ABNORMAL LOW (ref 6–20)
CO2: 19 mmol/L — ABNORMAL LOW (ref 22–32)
Calcium: 7.9 mg/dL — ABNORMAL LOW (ref 8.9–10.3)
Chloride: 92 mmol/L — ABNORMAL LOW (ref 98–111)
Creatinine, Ser: 0.86 mg/dL (ref 0.44–1.00)
GFR calc Af Amer: >60 mL/min (ref >60)
GFR calc non Af Amer: >60 mL/min (ref >60)
Glucose, Bld: 78 mg/dL (ref 70–99)
Potassium: 4.1 mmol/L (ref 3.5–5.1)
Sodium: 119 mmol/L — CL (ref 135–145)

## 2018-10-31 LAB — SODIUM
Sodium: 119 mmol/L — CL (ref 135–145)
Sodium: 120 mmol/L — ABNORMAL LOW (ref 135–145)
Sodium: 122 mmol/L — ABNORMAL LOW (ref 135–145)
Sodium: 123 mmol/L — ABNORMAL LOW (ref 135–145)
Sodium: 123 mmol/L — ABNORMAL LOW (ref 135–145)

## 2018-10-31 LAB — ACTH STIMULATION, 3 TIME POINTS
Cortisol, 30 Min: 3.1 ug/dL
Cortisol, 60 Min: 4.9 ug/dL
Cortisol, Base: 0.4 ug/dL

## 2018-10-31 LAB — CORTISOL-AM, BLOOD: Cortisol - AM: 0.5 ug/dL — ABNORMAL LOW (ref 6.7–22.6)

## 2018-10-31 LAB — CALCIUM, IONIZED: Calcium, Ionized, Serum: 4.2 mg/dL — ABNORMAL LOW (ref 4.5–5.6)

## 2018-10-31 MED ORDER — PHENOL 1.4 % MT LIQD
1.0000 | OROMUCOSAL | Status: DC | PRN
  Administered 2018-10-31: 10:00:00 1 via OROMUCOSAL
  Filled 2018-10-31: qty 177

## 2018-10-31 MED ORDER — LIP MEDEX EX OINT
TOPICAL_OINTMENT | CUTANEOUS | Status: AC
  Administered 2018-10-31: 11:00:00
  Filled 2018-10-31: qty 7

## 2018-10-31 MED ORDER — COSYNTROPIN NICU IV SYRINGE 0.25 MG/ML (STANDARD DOSE)
0.2500 mg | Freq: Once | INTRAVENOUS | Status: DC
  Filled 2018-10-31: qty 1

## 2018-10-31 MED ORDER — MENTHOL 3 MG MT LOZG
1.0000 | LOZENGE | OROMUCOSAL | Status: DC | PRN
  Filled 2018-10-31: qty 9

## 2018-10-31 MED ORDER — COSYNTROPIN 0.25 MG IJ SOLR
0.2500 mg | Freq: Once | INTRAMUSCULAR | Status: AC
  Administered 2018-10-31: 0.25 mg via INTRAVENOUS
  Filled 2018-10-31: qty 0.25

## 2018-10-31 NOTE — Progress Notes (Addendum)
[facility] Progress Note  Subjective: no new c/o, serum Na up to 119  Vitals:   10/31/18 0300 10/31/18 0400 10/31/18 0430 10/31/18 0800  BP: 122/90  130/67   Pulse: (!) 57 70 (!) 59   Resp: 12 10 14    Temp:    97.6 F (36.4 C)  TempSrc:    Oral  SpO2: 97% 100% 100%   Weight:      Height:        Inpatient medications: . Chlorhexidine Gluconate Cloth  6 each Topical Daily  . cosyntropin  0.25 mg Intravenous Once  . enoxaparin (LOVENOX) injection  40 mg Subcutaneous Daily  . fluticasone  1 spray Each Nare Daily   . sodium chloride (hypertonic) 26 mL/hr (10/31/18 0418)   acetaminophen **OR** acetaminophen, morphine injection, ondansetron (ZOFRAN) IV    Exam: Gen alert, lyilng flat, no distress, calm and pleasant No rash, cyanosis or gangrene Sclera anicteric, throat clear  No jvd or bruits Chest clear bilat RRR no MRG Abd soft ntnd no mass or ascites +bs GU defer MS no joint effusions or deformity Ext no LE or UE edema, no wounds or ulcers Neuro is alert, Ox 3 , nf    Home meds:  - alprzaolam 0.25 tid prn/ sertraline 50 qd/ bupropion 300 qd   Una 46,  Uosm 251   BUN  <5, creat 0.86  Assessment/ Rec: 1. Hyponatremia - in euvolemic patient. TSH not elevated.  Am cortisol pending.  DC SSRI and bupropion. She could have low solute/ tea& toast w/ low BUN as well vs medication-induced or SIADH.  CT brain and CXR are negative. Improving on 3% saline.  Continue until Na > 125.  2. Depression    [facility] 10/31/2018, 9:03 AM  Iron/TIBC/Ferritin/ %Sat No results found for: IRON, TIBC, FERRITIN, IRONPCTSAT Recent Labs  Lab 10/29/18 1841  10/30/18 0415  10/31/18 0613  NA 111*   < > 111*   < > 119*  K 3.5   < > 5.1  --  4.1  CL 78*   < > 82*  --  92*  CO2 21*   < > 18*  --  19*  GLUCOSE 75   < > 56*  --  78  BUN 6   < > <5*  --  <5*  CREATININE 0.90   < > 0.92  --  0.86  CALCIUM 8.4*   < > 7.6*  --  7.9*  PHOS  --    --  2.7  --   --   ALBUMIN 3.7  --   --   --   --    < > = values in this interval not displayed.   Recent Labs  Lab 10/29/18 1841  AST 40  ALT 24  ALKPHOS 54  BILITOT 0.5  PROT 6.9   Recent Labs  Lab 10/31/18 0613  WBC 5.0  HGB 13.2  HCT 37.1  PLT 297

## 2018-10-31 NOTE — Evaluation (Signed)
Physical Therapy Evaluation Patient Details Name: Colleen Smith MRN: [id number] DOB: [birth year] Today's Date: 10/31/2018   History of Present Illness  58 YO female admitted 10/29/18 with weakness, dehydration, hyponatremia. H/O depression   Clinical Impression  The patient is quite weak, flat affect. Patient reports that she has no family locally, neighbor has been assisting PTA. Reports being ill for 10 days PTA. Patient should progress to return to independence. Pt admitted with above diagnosis. Pt currently with functional limitations due to the deficits listed below (see PT Problem List).  Pt will benefit from skilled PT to increase their independence and safety with mobility to allow discharge to the venue listed below.   VSS throughout mobility, on RA.    Follow Up Recommendations Home health PT    Equipment Recommendations  (TBD-probably none)    Recommendations for Other Services OT consult     Precautions / Restrictions Precautions Precautions: Fall Precaution Comments: Na  low      Mobility  Bed Mobility Overal bed mobility: Needs Assistance Bed Mobility: Rolling;Sidelying to Sit Rolling: Supervision Sidelying to sit: Supervision          Transfers Overall transfer level: Needs assistance Equipment used: Rolling walker (2 wheeled) Transfers: Stand Pivot Transfers;Sit to/from Stand Sit to Stand: Min assist Stand pivot transfers: Min assist       General transfer comment: steady assist to rise and stabilize, extra time  Ambulation/Gait Ambulation/Gait assistance: Min assist Gait Distance (Feet): 6 Feet Assistive device: Rolling walker (2 wheeled) Gait Pattern/deviations: Step-to pattern;Step-through pattern     General Gait Details: shart distance forward then backward from recliner. steady assist  Stairs            Wheelchair Mobility    Modified Rankin (Stroke Patients Only)       Balance Overall balance assessment: Needs assistance    Sitting balance-Leahy Scale: Good     Standing balance support: During functional activity;Bilateral upper extremity supported Standing balance-Leahy Scale: Fair                               Pertinent Vitals/Pain Pain Assessment: Faces Faces Pain Scale: Hurts little more Pain Location: legs Pain Descriptors / Indicators: Aching Pain Intervention(s): Monitored during session    Home Living Family/patient expects to be discharged to:: Private residence Living Arrangements: Alone   Type of Home: House Home Access: Stairs to enter Entrance Stairs-Rails: Psychiatric nurse of Steps: 4 Home Layout: One level Home Equipment: None Additional Comments: reports  that neighbor  was assisting PTA    Prior Function Level of Independence: Independent         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, newspaper        Extremity/Trunk Assessment   Upper Extremity Assessment Upper Extremity Assessment: Defer to OT evaluation    Lower Extremity Assessment Lower Extremity Assessment: Generalized weakness    Cervical / Trunk Assessment Cervical / Trunk Assessment: Normal  Communication   Communication: No difficulties  Cognition Arousal/Alertness: Awake/alert Behavior During Therapy: WFL for tasks assessed/performed;Flat affect Overall Cognitive Status: Within Functional Limits for tasks assessed                                 General Comments: referred to calendar      General Comments      Exercises  Assessment/Plan    PT Assessment Patient needs continued PT services  PT Problem List Decreased strength;Decreased activity tolerance;Decreased mobility;Decreased knowledge of precautions;Decreased safety awareness;Decreased knowledge of use of DME       PT Treatment Interventions DME instruction;Therapeutic exercise;Gait training;Stair training;Functional mobility training;Therapeutic  activities;Patient/family education    PT Goals (Current goals can be found in the Care Plan section)  Acute Rehab PT Goals Patient Stated Goal: to go home PT Goal Formulation: With patient Time For Goal Achievement: 11/14/18 Potential to Achieve Goals: Good    Frequency Min 3X/week   Barriers to discharge Decreased caregiver support      Co-evaluation               AM-PAC PT "6 Clicks" Mobility  Outcome Measure Help needed turning from your back to your side while in a flat bed without using bedrails?: None Help needed moving from lying on your back to sitting on the side of a flat bed without using bedrails?: None Help needed moving to and from a bed to a chair (including a wheelchair)?: A Little Help needed standing up from a chair using your arms (e.g., wheelchair or bedside chair)?: A Little Help needed to walk in hospital room?: A Lot Help needed climbing 3-5 steps with a railing? : A Lot 6 Click Score: 18    End of Session Equipment Utilized During Treatment: Gait belt Activity Tolerance: Patient tolerated treatment well Patient left: in chair;with chair alarm set Nurse Communication: Mobility status PT Visit Diagnosis: Unsteadiness on feet (R26.81)    Time: 8295-6213 PT Time Calculation (min) (ACUTE ONLY): 37 min   Charges:   PT Evaluation $PT Eval Low Complexity: 1 Low PT Treatments $Gait Training: 8-22 mins        [place] Pager [phone] Office [phone]   Claretha Cooper 10/31/2018, 9:44 AM

## 2018-10-31 NOTE — Progress Notes (Signed)
PROGRESS NOTE    Colleen Smith  MHD:[id number] DOB: [birth year] DOA: 10/29/2018 PCP: Patient, No Pcp Per   Brief Narrative:  HPI ON 10/29/2018 by Dr. Thomes Lolling Colleen Smith is a 58 y.o. female with medical history significant of depression presenting to the hospital for evaluation of generalized weakness and vomiting.  Patient reports 3-day history of generalized weakness, nausea, vomiting, and frontal headaches.  Denies any abdominal pain or diarrhea.  States she has not been able to tolerate p.o. intake.  States the muscles in both of her legs have been hurting for the past few days.  Denies focal weakness.  Denies seizures.  She thinks she may have had fevers at home but did not check her temperature.  Denies any suprapubic pain, dysuria, urinary frequency, or urgency.  Denies cough, shortness of breath, or chest pain.  Interim history Admitted with hyponatremia. Nephrology consulted, started patient on 3% saline.   Assessment & Plan   Symptomatic severe hyponatremia -Presented with generalized weakness, dizziness and blurry vision -Sodium on admission 111, currently up to 119 -CT head unremarkable, chest x-ray unremarkable for infection -TSH <0.09, obtain free T4 -Discontinue Zoloft as this can also lead to hyponatremia (although patient has been on this for several years) -Patient currently appears to be euvolemic -AM cortisol and ACTH stim test pending  -Nephrology consulted and appreciated.  Discussed with Dr. Burnett Sheng, will place on 3% saline -urine sodium 46, urine osm 251 -Continue to monitor BMP  Viral gastroenteritis -Currently Afebrile and has no leukocytosis -Lipase and LFTs within normal limits -Complains of abdominal pain -Abdominal exam benign  Hypomagnesemia -resolved with repletion   Asymptomatic bacteriuria -Currently, patient afebrile and no leukocytosis.   -UA showed moderate leukocytes, 11-20 WBCs, rare bacteria, and negative nitrite.  -denies any  dysuria  -Urine culture pending   Abnormal thyroid studies -TSH very low at 0.09. -FT4 0.35 -Cortisol <0.4 on 4/15, pending repeat today -? Secondary to tertiary causes -Pending ACTH stim test -Will also try to discuss with endrocrinology   Generalized weakness -likely secondary to hyponatremia vs underlying thyroid issue -currently no source of infection  -Continue hydration -PT pending -Vitamin D level pending  Myalgia Patient reports soreness of all muscle groups in her bilateral lower extremities.  Not on a statin.   -CK 290 -TSH very low at 0.09. -work up pending   Headaches likely secondary to severe hyponatremia and acute sinusitis -headaches for the past 3 days, frontal -Neuro exam nonfocal -Head CT unremarkable for acute intracranial finding. Near pleat opacification of the left maxillary sinus -Continue pain control, Flonase, and treatment of hyponatremia as above  Depression -Continue Zoloft  DVT Prophylaxis  lovenox  Code Status: Full  Family Communication: None at bedside  Disposition Plan: Admitted. Dispo pending improvement in sodium levels.   Consultants Nephrology  Procedures  None  Antibiotics   Anti-infectives (From admission, onward)   None      Subjective:   Colleen Smith seen and examined today.  Patient continues to complain of leg pain, generalized weakness. Denies current chest pain, shortness of breath, abdominal pain, nausea, vomiting, diarrhea, constipation, dizziness.  Objective:   Vitals:   10/31/18 0238 10/31/18 0300 10/31/18 0400 10/31/18 0430  BP: (!) 137/91 122/90  130/67  Pulse: 78 (!) 57 70 (!) 59  Resp: 18 12 10 14   Temp:      TempSrc:      SpO2: 96% 97% 100% 100%  Weight:      Height:  Intake/Output Summary (Last 24 hours) at 10/31/2018 0839 Last data filed at 10/31/2018 0600 Gross per 24 hour  Intake 1182.84 ml  Output 1650 ml  Net -467.16 ml   Filed Weights   10/29/18 1831  Weight: 86.2 kg    Exam  General: Well developed, well nourished, NAD, appears stated age  11: NCAT, mucous membranes moist.   Neck: Supple  Cardiovascular: S1 S2 auscultated, no murmur, RRR  Respiratory: Clear to auscultation bilaterally  Abdomen: Soft, nontender, nondistended, + bowel sounds  Extremities: warm dry without cyanosis clubbing or edema  Neuro: AAOx3, nonfocal  Psych: Appropriate mood and affect  Data Reviewed: I have personally reviewed following labs and imaging studies  CBC: Recent Labs  Lab 10/29/18 1841 10/31/18 0613  WBC 6.5 5.0  NEUTROABS 2.2  --   HGB 13.7 13.2  HCT 37.3 37.1  MCV 80.7 83.4  PLT 287 546   Basic Metabolic Panel: Recent Labs  Lab 10/29/18 1841 10/29/18 2250 10/30/18 0415 10/30/18 1107 10/30/18 1512 10/30/18 2002 10/31/18 0203 10/31/18 0613  NA 111* 111* 111* 113* 115* 117* 119* 119*  K 3.5 3.7 5.1  --   --   --   --  4.1  CL 78* 81* 82*  --   --   --   --  92*  CO2 21* 21* 18*  --   --   --   --  19*  GLUCOSE 75 72 56*  --   --   --   --  78  BUN 6 7 <5*  --   --   --   --  <5*  CREATININE 0.90 0.85 0.92  --   --   --   --  0.86  CALCIUM 8.4* 7.8* 7.6*  --   --   --   --  7.9*  MG 1.5*  --  2.0  --   --   --   --   --   PHOS  --   --  2.7  --   --   --   --   --    GFR: Estimated Creatinine Clearance: 81.9 mL/min (by C-G formula based on SCr of 0.86 mg/dL). Liver Function Tests: Recent Labs  Lab 10/29/18 1841  AST 40  ALT 24  ALKPHOS 54  BILITOT 0.5  PROT 6.9  ALBUMIN 3.7   Recent Labs  Lab 10/29/18 1841  LIPASE 38   No results for input(s): AMMONIA in the last 168 hours. Coagulation Profile: No results for input(s): INR, PROTIME in the last 168 hours. Cardiac Enzymes: Recent Labs  Lab 10/29/18 1857 10/30/18 0030  CKTOTAL  --  290*  TROPONINI <0.03  --    BNP (last 3 results) No results for input(s): PROBNP in the last 8760 hours. HbA1C: No results for input(s): HGBA1C in the last 72 hours. CBG: Recent  Labs  Lab 10/30/18 0606 10/30/18 0838  GLUCAP 78 87   Lipid Profile: No results for input(s): CHOL, HDL, LDLCALC, TRIG, CHOLHDL, LDLDIRECT in the last 72 hours. Thyroid Function Tests: Recent Labs    10/29/18 2139 10/30/18 0415  TSH 0.090*  --   FREET4  --  0.35*   Anemia Panel: No results for input(s): VITAMINB12, FOLATE, FERRITIN, TIBC, IRON, RETICCTPCT in the last 72 hours. Urine analysis:    Component Value Date/Time   COLORURINE STRAW (A) 10/29/2018 1959   APPEARANCEUR CLEAR 10/29/2018 1959   LABSPEC 1.006 10/29/2018 1959   PHURINE 5.0 10/29/2018 1959  GLUCOSEU NEGATIVE 10/29/2018 1959   HGBUR NEGATIVE 10/29/2018 [place] NEGATIVE 10/29/2018 1959   KETONESUR 20 (A) 10/29/2018 1959   PROTEINUR NEGATIVE 10/29/2018 1959   NITRITE NEGATIVE 10/29/2018 1959   LEUKOCYTESUR MODERATE (A) 10/29/2018 1959   Sepsis Labs: @LABRCNTIP (procalcitonin:4,lacticidven:4)  ) Recent Results (from the past 240 hour(s))  Urine culture     Status: None   Collection Time: 10/29/18  7:59 PM  Result Value Ref Range Status   Specimen Description   Final    URINE, RANDOM Performed at [facility], [place] [address], [place], [place] [address]    Special Requests   Final    NONE Performed at [facility], [place] [address] [place], [place] [address]    Culture   Final    NO GROWTH Performed at [facility], [place] [address], [place], [place] [address]    Report Status 10/30/2018 FINAL  Final  MRSA PCR Screening     Status: None   Collection Time: 10/30/18  3:10 AM  Result Value Ref Range Status   MRSA by PCR NEGATIVE NEGATIVE Final    Comment:        The GeneXpert MRSA Assay (FDA approved for NASAL specimens only), is one component of a comprehensive MRSA colonization surveillance program. It is not intended to diagnose MRSA infection nor to guide or monitor treatment for MRSA infections. Performed at [facility], [place] [address] [place], [place] [address]       Radiology Studies: Ct Head Wo Contrast  Result Date: 10/29/2018 CLINICAL DATA:  Headache.  "Malignancy suspected". EXAM: CT HEAD WITHOUT CONTRAST TECHNIQUE: Contiguous axial images were obtained from the base of the skull through the vertex without intravenous contrast. COMPARISON:  10/06/2009 FINDINGS: Brain: Cerebral atrophy, age advanced. This results in prominence of the extra-axial spaces, primarily adjacent the frontal lobes. Mild degradation secondary to beam hardening artifact from patient's earrings (patient refused removal). No mass lesion, hemorrhage, hydrocephalus, acute infarct, intra-axial, or extra-axial fluid collection. Vascular: No hyperdense vessel or unexpected calcification. Skull: Normal Sinuses/Orbits: Normal imaged portions of the orbits and globes. Near complete opacification of the left maxillary sinus, new. There is minimal mucosal thickening of left ethmoid air cells. Clear mastoid air cells. Other: None. IMPRESSION: 1.  No acute intracranial abnormality. 2. Mild age advanced cerebral atrophy. 3. Sinus disease. Electronically Signed   By: Abigail Miyamoto M.D.   On: 10/29/2018 21:23   Dg Chest Portable 1 View  Result Date: 10/29/2018 CLINICAL DATA:  Nausea and vomiting EXAM: PORTABLE CHEST 1 VIEW COMPARISON:  None. FINDINGS: The heart size and mediastinal contours are within normal limits. Both lungs are clear. The visualized skeletal structures are unremarkable. IMPRESSION: No active disease. Electronically Signed   By: Ulyses Jarred M.D.   On: 10/29/2018 19:21     Scheduled Meds: . Chlorhexidine Gluconate Cloth  6 each Topical Daily  . cosyntropin  0.25 mg Intravenous Once  . enoxaparin (LOVENOX) injection  40 mg Subcutaneous Daily  . fluticasone  1 spray Each Nare Daily   Continuous Infusions: . sodium chloride (hypertonic) 26 mL/hr (10/31/18 0418)     LOS: 2 days   Time Spent in minutes   45 minutes   Canon Gola D.O. on 10/31/2018 at 8:39 AM  Between 7am to 7pm - Please see pager noted on [email or web address]  After 7pm go to [email or web address]  And look for the night coverage person covering for me after hours  [place]  [phone]

## 2018-11-01 ENCOUNTER — Inpatient Hospital Stay (HOSPITAL_COMMUNITY): Payer: 59

## 2018-11-01 ENCOUNTER — Encounter (HOSPITAL_COMMUNITY): Payer: Self-pay

## 2018-11-01 LAB — SODIUM
Sodium: 123 mmol/L — ABNORMAL LOW (ref 135–145)
Sodium: 128 mmol/L — ABNORMAL LOW (ref 135–145)
Sodium: 129 mmol/L — ABNORMAL LOW (ref 135–145)

## 2018-11-01 LAB — BASIC METABOLIC PANEL
Anion gap: 9 (ref 5–15)
BUN: <5 mg/dL — ABNORMAL LOW (ref 6–20)
CO2: 21 mmol/L — ABNORMAL LOW (ref 22–32)
Calcium: 8.5 mg/dL — ABNORMAL LOW (ref 8.9–10.3)
Chloride: 97 mmol/L — ABNORMAL LOW (ref 98–111)
Creatinine, Ser: 0.86 mg/dL (ref 0.44–1.00)
GFR calc Af Amer: >60 mL/min (ref >60)
GFR calc non Af Amer: >60 mL/min (ref >60)
Glucose, Bld: 65 mg/dL — ABNORMAL LOW (ref 70–99)
Potassium: 4.5 mmol/L (ref 3.5–5.1)
Sodium: 127 mmol/L — ABNORMAL LOW (ref 135–145)

## 2018-11-01 LAB — VITAMIN D 25 HYDROXY (VIT D DEFICIENCY, FRACTURES): Vit D, 25-Hydroxy: 36.5 ng/mL (ref 30.0–100.0)

## 2018-11-01 LAB — ACTH: C206 ACTH: <1.5 pg/mL — ABNORMAL LOW (ref 7.2–63.3)

## 2018-11-01 LAB — OSMOLALITY, URINE: Osmolality, Ur: 198 mOsm/kg — ABNORMAL LOW (ref 300–900)

## 2018-11-01 LAB — SODIUM, URINE, RANDOM: Sodium, Ur: 48 mmol/L

## 2018-11-01 MED ORDER — HYDROCORTISONE 5 MG PO TABS
15.0000 mg | ORAL_TABLET | Freq: Every day | ORAL | Status: DC
  Administered 2018-11-01 – 2018-11-04 (×4): 15 mg via ORAL
  Filled 2018-11-01 (×4): qty 1

## 2018-11-01 MED ORDER — ZOLPIDEM TARTRATE 5 MG PO TABS
5.0000 mg | ORAL_TABLET | Freq: Once | ORAL | Status: AC
  Administered 2018-11-02: 01:00:00 5 mg via ORAL
  Filled 2018-11-01: qty 1

## 2018-11-01 MED ORDER — HYDROCORTISONE 5 MG PO TABS
5.0000 mg | ORAL_TABLET | ORAL | Status: DC
  Administered 2018-11-01 – 2018-11-03 (×3): 5 mg via ORAL
  Filled 2018-11-01 (×4): qty 1

## 2018-11-01 MED ORDER — HYDROCORTISONE 5 MG PO TABS
15.0000 mg | ORAL_TABLET | Freq: Every day | ORAL | Status: DC
  Filled 2018-11-01: qty 1

## 2018-11-01 MED ORDER — GADOBUTROL 1 MMOL/ML IV SOLN
7.5000 mL | Freq: Once | INTRAVENOUS | Status: AC | PRN
  Administered 2018-11-01: 7.5 mL via INTRAVENOUS

## 2018-11-01 NOTE — Progress Notes (Signed)
[facility] Progress Note  Subjective: no new c/o, serum Na up to 127 this am. Pt feeling better, vague complaints.  Am cortisol was < 0.4 and repeat 0.5 ug/dL. Started on bid physiologic hydrocortisone.    Vitals:   11/01/18 0400 11/01/18 0500 11/01/18 0600 11/01/18 0700  BP: (!) 117/45  103/63   Pulse: 63 64 64 63  Resp: 12 10 12    Temp: 98.1 F (36.7 C)     TempSrc: Oral     SpO2: 96% 94% 99% 99%  Weight:      Height:        Inpatient medications: . Chlorhexidine Gluconate Cloth  6 each Topical Daily  . enoxaparin (LOVENOX) injection  40 mg Subcutaneous Daily  . fluticasone  1 spray Each Nare Daily  . hydrocortisone  15 mg Oral QAC breakfast  . hydrocortisone  5 mg Oral Daily    acetaminophen **OR** acetaminophen, menthol-cetylpyridinium, morphine injection, ondansetron (ZOFRAN) IV, phenol    Exam: Gen alert, lyilng flat, no distress, calm and pleasant No rash, cyanosis or gangrene Sclera anicteric, throat clear  No jvd or bruits Chest clear bilat RRR no MRG Abd soft ntnd no mass or ascites +bs GU defer MS no joint effusions or deformity Ext no LE or UE edema, no wounds or ulcers Neuro is alert, Ox 3 , nf    Home meds:  - alprzaolam 0.25 tid prn/ sertraline 50 qd/ bupropion 300 qd   Una 46,  Uosm 251     Assessment/ Rec: 1. Hyponatremia - in euvolemic patient. Serum cortisol levels extremely low c/w adrenal insufficiency.  Started on hydrocortisone bid physiologic dose.  She can be restarted on her antidepressants as needed. She may or may not need mineralocorticoid replacement, will get baseline PRA and check orthostatics. No fluid restriction needed given that adrenal insuff is cause of hyponatremia in this case.  2. Depression    [facility] 11/01/2018, 8:05 AM  Iron/TIBC/Ferritin/ %Sat No results found for: IRON, TIBC, FERRITIN, IRONPCTSAT Recent Labs  Lab 10/29/18 1841  10/30/18 0415  11/01/18 0528  NA  111*   < > 111*   < > 127*  K 3.5   < > 5.1   < > 4.5  CL 78*   < > 82*   < > 97*  CO2 21*   < > 18*   < > 21*  GLUCOSE 75   < > 56*   < > 65*  BUN 6   < > <5*   < > <5*  CREATININE 0.90   < > 0.92   < > 0.86  CALCIUM 8.4*   < > 7.6*   < > 8.5*  PHOS  --   --  2.7  --   --   ALBUMIN 3.7  --   --   --   --    < > = values in this interval not displayed.   Recent Labs  Lab 10/29/18 1841  AST 40  ALT 24  ALKPHOS 54  BILITOT 0.5  PROT 6.9   Recent Labs  Lab 10/31/18 0613  WBC 5.0  HGB 13.2  HCT 37.1  PLT 297

## 2018-11-01 NOTE — Progress Notes (Signed)
PROGRESS NOTE    Colleen Smith  EXB:[id number] DOB: 06-25-1961 DOA: 10/29/2018 PCP: Patient, No Pcp Per   Brief Narrative:  HPI ON 10/29/2018 by Dr. Thomes Lolling Colleen Smith is a 58 y.o. female with medical history significant of depression presenting to the hospital for evaluation of generalized weakness and vomiting.  Patient reports 3-day history of generalized weakness, nausea, vomiting, and frontal headaches.  Denies any abdominal pain or diarrhea.  States she has not been able to tolerate p.o. intake.  States the muscles in both of her legs have been hurting for the past few days.  Denies focal weakness.  Denies seizures.  She thinks she may have had fevers at home but did not check her temperature.  Denies any suprapubic pain, dysuria, urinary frequency, or urgency.  Denies cough, shortness of breath, or chest pain.  Interim history Admitted with hyponatremia. Nephrology consulted, started patient on 3% saline. Sodium has improved. Cortisol levels were very low, started steroids.   Assessment & Plan   Symptomatic severe hyponatremia -Presented with generalized weakness, dizziness and blurry vision -Sodium on admission 111, currently up to 127 -CT head unremarkable, chest x-ray unremarkable for infection -TSH <0.09, obtain free T4 -Discontinue Zoloft as this can also lead to hyponatremia (although patient has been on this for several years) -Patient currently appears to be euvolemic -Nephrology consulted and appreciated.  Discussed with Dr. Jonnie Finner, placed on 3%-has now been discontinued-urine sodium 46, urine osm 251 -A.m. cortisol as well as ACTH obtained.  A.m. cortisol was low, ACTH stimulation test was positive however with minimal elevation. -Continue to monitor BMP  Viral gastroenteritis -Currently Afebrile and has no leukocytosis -Lipase and LFTs within normal limits -Complains of abdominal pain -Abdominal exam benign  Hypomagnesemia -resolved with repletion    Asymptomatic bacteriuria -Currently, patient afebrile and no leukocytosis.   -UA showed moderate leukocytes, 11-20 WBCs, rare bacteria, and negative nitrite.  -denies any dysuria  -Urine culture showed no growth  Abnormal thyroid studies- ?  Central cause -TSH very low at 0.09. -FT4 0.35 -Cortisol <0.4 on 4/15 -ACTH stimulation test did show improvement with administration however continue to be low. -Discussed with Dr. Buddy Duty, endocrinology, via phone on 10/31/2018.  Agreed with starting steroids.  Recommended delaying the start of Synthroid for a couple days after starting steroids.  Will follow-up with patient in the office. -Obtain MRI with and without contrast, focusing on the pituitary gland: 1 x 1 x 13 mm pituitary lesion with enhancing rim and internal nonenhancing fluid, most consistent with necrotic pituitary adenoma. -Will discuss MRI findings with neurosurgery.  Generalized weakness -likely secondary to hyponatremia vs underlying thyroid issue -currently no source of infection  -Continue hydration -PT commended home health and OT consult -Vitamin D level 36.5   Myalgia Patient reports soreness of all muscle groups in her bilateral lower extremities.  Not on a statin.   -CK 290 -TSH very low at 0.09. -Likely secondary to the above  Headaches likely secondary to severe hyponatremia and acute sinusitis -headaches for the past 3 days, frontal -Neuro exam nonfocal -Head CT unremarkable for acute intracranial finding. Near pleat opacification of the left maxillary sinus -Continue pain control, Flonase, and treatment of hyponatremia as above  Depression -Continue Zoloft  DVT Prophylaxis  lovenox  Code Status: Full  Family Communication: None at bedside  Disposition Plan: Admitted. Dispo pending improvement in sodium levels.   Consultants Nephrology Endocrinology, via phone only, Dr. Buddy Duty  Procedures  None  Antibiotics   Anti-infectives (From  admission,  onward)   None      Subjective:   Colleen Smith seen and examined today.  Patient feeling better this morning.  States she would like to try to eat her breakfast.  Did not like having MRI this morning.  Denies current chest pain, shortness of breath, abdominal pain, nausea or vomiting, diarrhea or constipation.  Objective:   Vitals:   11/01/18 0400 11/01/18 0500 11/01/18 0600 11/01/18 0700  BP: (!) 117/45  103/63   Pulse: 63 64 64 63  Resp: 12 10 12    Temp: 98.1 F (36.7 C)     TempSrc: Oral     SpO2: 96% 94% 99% 99%  Weight:      Height:        Intake/Output Summary (Last 24 hours) at 11/01/2018 1025 Last data filed at 11/01/2018 0900 Gross per 24 hour  Intake 1266.97 ml  Output 1000 ml  Net 266.97 ml   Filed Weights   10/29/18 1831  Weight: 86.2 kg   Exam  General: Well developed, well nourished, NAD, appears stated age  36: NCAT, mucous membranes moist.   Neck: Supple  Cardiovascular: S1 S2 auscultated, no murmur, RRR  Respiratory: Clear to auscultation bilaterally   Abdomen: Soft, nontender, nondistended, + bowel sounds  Extremities: warm dry without cyanosis clubbing or edema  Neuro: AAOx3, nonfocal  Psych: Pleasant, appropriate mood and affect  Data Reviewed: I have personally reviewed following labs and imaging studies  CBC: Recent Labs  Lab 10/29/18 1841 10/31/18 0613  WBC 6.5 5.0  NEUTROABS 2.2  --   HGB 13.7 13.2  HCT 37.3 37.1  MCV 80.7 83.4  PLT 287 867   Basic Metabolic Panel: Recent Labs  Lab 10/29/18 1841 10/29/18 2250 10/30/18 0415  10/31/18 0613  10/31/18 1359 10/31/18 1816 10/31/18 2148 11/01/18 0113 11/01/18 0528  NA 111* 111* 111*   < > 119*   < > 122* 123* 123* 123* 127*  K 3.5 3.7 5.1  --  4.1  --   --   --   --   --  4.5  CL 78* 81* 82*  --  92*  --   --   --   --   --  97*  CO2 21* 21* 18*  --  19*  --   --   --   --   --  21*  GLUCOSE 75 72 56*  --  78  --   --   --   --   --  65*  BUN 6 7 <5*  --  <5*  --    --   --   --   --  <5*  CREATININE 0.90 0.85 0.92  --  0.86  --   --   --   --   --  0.86  CALCIUM 8.4* 7.8* 7.6*  --  7.9*  --   --   --   --   --  8.5*  MG 1.5*  --  2.0  --   --   --   --   --   --   --   --   PHOS  --   --  2.7  --   --   --   --   --   --   --   --    < > = values in this interval not displayed.   GFR: Estimated Creatinine Clearance: 81.9 mL/min (by C-G formula based on SCr  of 0.86 mg/dL). Liver Function Tests: Recent Labs  Lab 10/29/18 1841  AST 40  ALT 24  ALKPHOS 54  BILITOT 0.5  PROT 6.9  ALBUMIN 3.7   Recent Labs  Lab 10/29/18 1841  LIPASE 38   No results for input(s): AMMONIA in the last 168 hours. Coagulation Profile: No results for input(s): INR, PROTIME in the last 168 hours. Cardiac Enzymes: Recent Labs  Lab 10/29/18 1857 10/30/18 0030  CKTOTAL  --  290*  TROPONINI <0.03  --    BNP (last 3 results) No results for input(s): PROBNP in the last 8760 hours. HbA1C: No results for input(s): HGBA1C in the last 72 hours. CBG: Recent Labs  Lab 10/30/18 0606 10/30/18 0838  GLUCAP 78 87   Lipid Profile: No results for input(s): CHOL, HDL, LDLCALC, TRIG, CHOLHDL, LDLDIRECT in the last 72 hours. Thyroid Function Tests: Recent Labs    10/29/18 2139 10/30/18 0415  TSH 0.090*  --   FREET4  --  0.35*   Anemia Panel: No results for input(s): VITAMINB12, FOLATE, FERRITIN, TIBC, IRON, RETICCTPCT in the last 72 hours. Urine analysis:    Component Value Date/Time   COLORURINE STRAW (A) 10/29/2018 1959   APPEARANCEUR CLEAR 10/29/2018 1959   LABSPEC 1.006 10/29/2018 1959   PHURINE 5.0 10/29/2018 [place] NEGATIVE 10/29/2018 1959   HGBUR NEGATIVE 10/29/2018 [place] NEGATIVE 10/29/2018 1959   KETONESUR 20 (A) 10/29/2018 1959   PROTEINUR NEGATIVE 10/29/2018 1959   NITRITE NEGATIVE 10/29/2018 1959   LEUKOCYTESUR MODERATE (A) 10/29/2018 1959   Sepsis Labs: @LABRCNTIP (procalcitonin:4,lacticidven:4)  ) Recent Results  (from the past 240 hour(s))  Urine culture     Status: None   Collection Time: 10/29/18  7:59 PM  Result Value Ref Range Status   Specimen Description   Final    URINE, RANDOM Performed at [facility], [place] [address] [place], [place] [address]    Special Requests   Final    NONE Performed at [facility], [place] [address], [place], [place] [address]    Culture   Final    NO GROWTH Performed at [facility], [place] [address], [place],  [address]    Report Status 10/30/2018 FINAL  Final  MRSA PCR Screening     Status: None   Collection Time: 10/30/18  3:10 AM  Result Value Ref Range Status   MRSA by PCR NEGATIVE NEGATIVE Final    Comment:        The GeneXpert MRSA Assay (FDA approved for NASAL specimens only), is one component of a comprehensive MRSA colonization surveillance program. It is not intended to diagnose MRSA infection nor to guide or monitor treatment for MRSA infections. Performed at [facility], [place] [address] [place],  [address]       Radiology Studies: Mr Jeri Cos IE Contrast  Result Date: 11/01/2018 CLINICAL DATA:  Pituitary/endocrine dysfunction. Nausea and vomiting. EXAM: MRI HEAD WITHOUT AND WITH CONTRAST TECHNIQUE: Multiplanar, multiecho pulse sequences of the brain and surrounding structures were obtained without and with intravenous contrast. CONTRAST:  7.5 cc Gadavist COMPARISON:  Head CT 10/29/2018 and 10/06/2009. FINDINGS: Brain: Brain parenchyma itself does not show any acute or subacute infarction. Brainstem and cerebellum are normal. 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 There is mild chronic gliosis and volume loss in the deep insula and  left parietal lobe consistent with an old ischemic insult in this region. There is no evidence of intra-axial mass  lesion, hemorrhage, hydrocephalus or extra-axial collection. There is a cystic or necrotic lesion within the pituitary gland measuring a cm in size. Along the anterior margin, this has a bifid extension which measures right-to-left up to 13 mm. There does appear to be some enhancing pituitary tissue around the margins. 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 There is no T1 hyperintensity precontrast or apparent susceptibility to suggest pituitary apoplexy/hemorrhage, but that is not completely excluded. Primary pituitary abscess is quite rare. Vascular: Major vessels at the base of the brain show flow. Skull and upper cervical spine: Negative Sinuses/Orbits: Acute left maxillary sinusitis.  Orbits negative. 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 See above discussion. The differential diagnosis does include atypical Rathke's cleft cyst, pituitary apoplexy and pituitary abscess, but those are not favored. Electronically Signed   By: Nelson Chimes M.D.   On: 11/01/2018 08:55     Scheduled Meds:  Chlorhexidine Gluconate Cloth  6 each Topical Daily   enoxaparin (LOVENOX) injection  40 mg Subcutaneous Daily   fluticasone  1 spray Each Nare Daily   hydrocortisone  15 mg Oral QAC breakfast   hydrocortisone  5 mg Oral Daily   Continuous Infusions:    LOS: 3 days   Time Spent in minutes   45 minutes  Tyrez Berrios D.O. on 11/01/2018 at 10:25 AM  Between 7am to 7pm - Please see pager noted on [email or web address]  After 7pm go to [email or web address]  And look for the night coverage person covering for me after hours  [facility] Office  [phone]

## 2018-11-01 NOTE — Evaluation (Signed)
Occupational Therapy Evaluation Patient Details Name: Colleen Smith MRN: [id number] DOB: 1960-11-28 Today's Date: 11/01/2018    History of Present Illness 58 YO female admitted 10/29/18 with weakness, dehydration, hyponatremia. H/O depression    Clinical Impression   Pt was admitted for the above. She is unsteady and needs min A to stand, ambulate and perform LB adls.  Pt lives alone and is independent at baseline:  When weak prior to admission, neighbor assisted with food.  Initiated level one theraband program and started energy conservation education this session. Will follow with the goals listed below (supervision level)    Follow Up Recommendations  Home health OT    Equipment Recommendations  (tba further; has standard commode)    Recommendations for Other Services       Precautions / Restrictions Precautions Precautions: Fall Precaution Comments: Na  Low 127 today Restrictions Weight Bearing Restrictions: No      Mobility Bed Mobility       Sidelying to sit: Supervision          Transfers   Equipment used: Rolling walker (2 wheeled)   Sit to Stand: Min assist         General transfer comment: min A to stand and ambulate in room    Balance                                           ADL either performed or assessed with clinical judgement   ADL Overall ADL's : Needs assistance/impaired Eating/Feeding: Independent   Grooming: Set up;Sitting   Upper Body Bathing: Set up;Sitting   Lower Body Bathing: Minimal assistance;Sit to/from stand   Upper Body Dressing : Set up;Sitting   Lower Body Dressing: Minimal assistance;Sit to/from stand   Toilet Transfer: Minimal assistance;Ambulation;RW(simulated, bed)   Toileting- Clothing Manipulation and Hygiene: Minimal assistance;Sit to/from stand         General ADL Comments: pt ambulated to sink using RW.  Legs feels weak and she is unsteady.  Able to cross legs for socks. Started  Dispensing optician      Pertinent Vitals/Pain Pain Assessment: Faces Faces Pain Scale: Hurts little more Pain Location: legs Pain Descriptors / Indicators: Aching Pain Intervention(s): Limited activity within patient's tolerance;Monitored during session;Premedicated before session;Repositioned     Hand Dominance     Extremity/Trunk Assessment Upper Extremity Assessment Upper Extremity Assessment: Generalized weakness           Communication Communication Communication: No difficulties   Cognition Arousal/Alertness: Awake/alert Behavior During Therapy: WFL for tasks assessed/performed;Flat affect Overall Cognitive Status: Within Functional Limits for tasks assessed                                     General Comments       Exercises Exercises: Other exercises Other Exercises Other Exercises: performed exercises with level one theraband:  FF, elbow flexion and horizontal abd. will need written HEP on next visit   Shoulder Instructions      Home Living Family/patient expects to be discharged to:: Private residence Living Arrangements: Alone   Type of Home: House             Bathroom Shower/Tub: Walk-in shower  Bathroom Toilet: Standard     Home Equipment: Shower seat - built in   Additional Comments: neighbor was getting food/drinks for her.       Prior Functioning/Environment Level of Independence: Independent                 OT Problem List: Decreased strength;Decreased activity tolerance;Impaired balance (sitting and/or standing);Decreased knowledge of use of DME or AE;Pain      OT Treatment/Interventions: Self-care/ADL training;Energy conservation;DME and/or AE instruction;Balance training;Therapeutic activities;Patient/family education;Therapeutic exercise    OT Goals(Current goals can be found in the care plan section) Acute Rehab OT Goals Patient Stated Goal: to  go home OT Goal Formulation: With patient Time For Goal Achievement: 11/15/18 Potential to Achieve Goals: Good ADL Goals Pt Will Perform Grooming: with supervision;standing Pt Will Transfer to Toilet: with supervision;ambulating;bedside commode Additional ADL Goal #1: pt will perform ADL with setup/supervision initiating at least one rest break as needed for adls Additional ADL Goal #2: pt will be independent with level one theraband program with handout  OT Frequency: Min 2X/week   Barriers to D/C:            Co-evaluation              AM-PAC OT "6 Clicks" Daily Activity     Outcome Measure Help from another person eating meals?: None Help from another person taking care of personal grooming?: A Little Help from another person toileting, which includes using toliet, bedpan, or urinal?: A Little Help from another person bathing (including washing, rinsing, drying)?: A Little Help from another person to put on and taking off regular upper body clothing?: A Little Help from another person to put on and taking off regular lower body clothing?: A Little 6 Click Score: 19   End of Session    Activity Tolerance: Patient tolerated treatment well Patient left: in chair;with call bell/phone within reach;with chair alarm set  OT Visit Diagnosis: Unsteadiness on feet (R26.81);Muscle weakness (generalized) (M62.81)                Time: 7353-2992 OT Time Calculation (min): 22 min Charges:  OT General Charges $OT Visit: 1 Visit OT Evaluation $OT Eval Low Complexity: [name], OTR/L Acute Rehabilitation Services [phone] [facility] pager [phone] office 11/01/2018  [name] 11/01/2018, 11:02 AM

## 2018-11-01 NOTE — Progress Notes (Signed)
Physical Therapy Treatment Patient Details Name: Colleen Smith MRN: [id number] DOB: 16-Jan-1961 Today's Date: 11/01/2018    History of Present Illness 58 YO female admitted 10/29/18 with weakness, dehydration, hyponatremia. H/O depression     PT Comments    Pt very cooperative and with marked improvement in activity tolerance but limited by elevating HR with activity.   Follow Up Recommendations  Home health PT     Equipment Recommendations  Rolling walker with 5" wheels    Recommendations for Other Services OT consult     Precautions / Restrictions Precautions Precautions: Fall Precaution Comments: Na  low Restrictions Weight Bearing Restrictions: No    Mobility  Bed Mobility Overal bed mobility: Needs Assistance Bed Mobility: Supine to Sit     Supine to sit: Supervision        Transfers Overall transfer level: Needs assistance Equipment used: Rolling walker (2 wheeled) Transfers: Sit to/from Stand Sit to Stand: Min assist;Min guard         General transfer comment: cues for use of UEs to self assist  Ambulation/Gait Ambulation/Gait assistance: Min assist;Min guard Gait Distance (Feet): 200 Feet Assistive device: Rolling walker (2 wheeled) Gait Pattern/deviations: Step-to pattern;Step-through pattern     General Gait Details: cues for posture and position from RW; multiple standing rest breaks 2* elevating HR - RN aware   Stairs             Wheelchair Mobility    Modified Rankin (Stroke Patients Only)       Balance Overall balance assessment: Needs assistance Sitting-balance support: No upper extremity supported;Feet supported Sitting balance-Leahy Scale: Good     Standing balance support: During functional activity;Bilateral upper extremity supported Standing balance-Leahy Scale: Fair                              Cognition Arousal/Alertness: Awake/alert Behavior During Therapy: WFL for tasks assessed/performed;Flat  affect Overall Cognitive Status: Within Functional Limits for tasks assessed                                        Exercises      General Comments        Pertinent Vitals/Pain Pain Assessment: Faces Faces Pain Scale: Hurts little more Pain Location: legs Pain Descriptors / Indicators: Aching Pain Intervention(s): Limited activity within patient's tolerance;Monitored during session    Home Living                      Prior Function            PT Goals (current goals can now be found in the care plan section) Acute Rehab PT Goals Patient Stated Goal: to go home PT Goal Formulation: With patient Time For Goal Achievement: 11/14/18 Potential to Achieve Goals: Good Progress towards PT goals: Progressing toward goals    Frequency    Min 3X/week      PT Plan Current plan remains appropriate    Co-evaluation              AM-PAC PT "6 Clicks" Mobility   Outcome Measure  Help needed turning from your back to your side while in a flat bed without using bedrails?: None Help needed moving from lying on your back to sitting on the side of a flat bed without using bedrails?: None Help needed moving to  and from a bed to a chair (including a wheelchair)?: A Little Help needed standing up from a chair using your arms (e.g., wheelchair or bedside chair)?: A Little Help needed to walk in hospital room?: A Little Help needed climbing 3-5 steps with a railing? : A Little 6 Click Score: 20    End of Session Equipment Utilized During Treatment: Gait belt Activity Tolerance: Patient tolerated treatment well Patient left: in chair;with chair alarm set Nurse Communication: Mobility status PT Visit Diagnosis: Unsteadiness on feet (R26.81)     Time: 7353-2992 PT Time Calculation (min) (ACUTE ONLY): 15 min  Charges:  $Gait Training: 8-22 mins                     [place] Pager [phone] Office  [phone]    Lamarco Gudiel 11/01/2018, 3:46 PM

## 2018-11-01 NOTE — Progress Notes (Signed)
Called RN at [phone] when order was put in but RN informed us it was shift change and she was unable to let us know when pt could come down. Informed RN we could get pt now but she was in shift change so it will be this afternoon.

## 2018-11-02 LAB — BASIC METABOLIC PANEL
Anion gap: 8 (ref 5–15)
BUN: 6 mg/dL (ref 6–20)
CO2: 23 mmol/L (ref 22–32)
Calcium: 9.1 mg/dL (ref 8.9–10.3)
Chloride: 99 mmol/L (ref 98–111)
Creatinine, Ser: 0.99 mg/dL (ref 0.44–1.00)
GFR calc Af Amer: >60 mL/min (ref >60)
GFR calc non Af Amer: >60 mL/min (ref >60)
Glucose, Bld: 74 mg/dL (ref 70–99)
Potassium: 4.3 mmol/L (ref 3.5–5.1)
Sodium: 130 mmol/L — ABNORMAL LOW (ref 135–145)

## 2018-11-02 LAB — GROWTH HORMONE: Growth Hormone: 0.1 ng/mL (ref 0.0–10.0)

## 2018-11-02 MED ORDER — SERTRALINE HCL 50 MG PO TABS
50.0000 mg | ORAL_TABLET | Freq: Every day | ORAL | Status: DC
  Administered 2018-11-02 – 2018-11-04 (×3): 50 mg via ORAL
  Filled 2018-11-02 (×3): qty 1

## 2018-11-02 MED ORDER — ZOLPIDEM TARTRATE 5 MG PO TABS
5.0000 mg | ORAL_TABLET | Freq: Every evening | ORAL | Status: DC | PRN
  Administered 2018-11-02 – 2018-11-03 (×2): 5 mg via ORAL
  Filled 2018-11-02 (×2): qty 1

## 2018-11-02 MED ORDER — TRAMADOL HCL 50 MG PO TABS
50.0000 mg | ORAL_TABLET | Freq: Four times a day (QID) | ORAL | Status: DC | PRN
  Administered 2018-11-02 – 2018-11-04 (×8): 50 mg via ORAL
  Filled 2018-11-02 (×8): qty 1

## 2018-11-02 NOTE — Progress Notes (Signed)
Pt hr drops to 47 with sleep. Pt is easily aroused from sleep and hr then increases to above 60. Hr remains variable between 52-70's NSR. Pt remains stable overall. No needs at this time.

## 2018-11-02 NOTE — Progress Notes (Addendum)
[facility] Progress Note  Subjective: Na up to 130, Uosm down to 198. Pt feeling better.   Vitals:   11/02/18 0000 11/02/18 0400 11/02/18 0455 11/02/18 0733  BP: 117/66 (!) 102/58 100/83   Pulse: 68 71 68 61  Resp: 14  15 16   Temp: 97.8 F (36.6 C) 98.1 F (36.7 C)    TempSrc: Oral Oral    SpO2: 96% 92% 98% 94%  Weight:      Height:        Inpatient medications: . Chlorhexidine Gluconate Cloth  6 each Topical Daily  . enoxaparin (LOVENOX) injection  40 mg Subcutaneous Daily  . fluticasone  1 spray Each Nare Daily  . hydrocortisone  15 mg Oral QAC breakfast  . hydrocortisone  5 mg Oral Daily  . sertraline  50 mg Oral Daily    acetaminophen **OR** acetaminophen, menthol-cetylpyridinium, morphine injection, ondansetron (ZOFRAN) IV, phenol, traMADol    Exam: Gen alert, on distress No rash, cyanosis or gangrene Sclera anicteric, throat clear  No jvd or bruits Chest clear bilat RRR no MRG Abd soft ntnd no mass or ascites +bs GU defer MS no joint effusions or deformity Ext no LE or UE edema, no wounds or ulcers Neuro is alert, Ox 3 , nf    Home meds:  - alprzaolam 0.25 tid prn/ sertraline 50 qd/ bupropion 300 qd   Una 46,  Uosm 251     Assessment/ Rec: 1. Hyponatremia - in euvolemic patient. Serum cortisol levels extremely low c/w adrenal insufficiency.  Started on hydrocortisone bid physiologic dose.  She can be restarted on her antidepressants as needed. She has unmeasurable ACTH level and necrotic pituitary lesion on MRI which suggests secondary adrenal insufficiency.  Hyponatremia in adrenal insuffuicency is due primarily felt to be due to high CRH levels which stimulate ADH secretion.  With physiologic hydrocortisone replacement the urinary osm is now appropriately low and serum Na+ is rising on it's on, no further suggestions. 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  Will sign off.  2. Pituitary lesion by MRI - per primary 3. Depression    Rob [facility] 11/02/2018, 8:17 AM  Iron/TIBC/Ferritin/ %Sat No results found for: IRON, TIBC, FERRITIN, IRONPCTSAT Recent Labs  Lab 10/29/18 1841  10/30/18 0415  11/02/18 0303  NA 111*   < > 111*   < > 130*  K 3.5   < > 5.1   < > 4.3  CL 78*   < > 82*   < > 99  CO2 21*   < > 18*   < > 23  GLUCOSE 75   < > 56*   < > 74  BUN 6   < > <5*   < > 6  CREATININE 0.90   < > 0.92   < > 0.99  CALCIUM 8.4*   < > 7.6*   < > 9.1  PHOS  --   --  2.7  --   --   ALBUMIN 3.7  --   --   --   --    < > = values in this interval not displayed.   Recent Labs  Lab 10/29/18 1841  AST 40  ALT 24  ALKPHOS 54  BILITOT 0.5  PROT 6.9   Recent Labs  Lab 10/31/18 0613  WBC  5.0  HGB 13.2  HCT 37.1  PLT 297

## 2018-11-02 NOTE — Progress Notes (Signed)
Pt ambulated in hall with physical therapy assist. Pt did have a slight dizzy episode with ambulation. No changes to note overall.

## 2018-11-02 NOTE — Progress Notes (Signed)
Md notified of pt heart rate in 40's with sleep. No new orders obtained. Pt to transfer to med surg floor once order is placed.

## 2018-11-02 NOTE — Progress Notes (Signed)
Report called to RN on 5E with no complication. No needs at this time. RN will transfer pt down soon. Will continue to monitor.

## 2018-11-02 NOTE — Progress Notes (Signed)
PROGRESS NOTE    Colleen Smith  ALP:[id number] DOB: 01-09-1961 DOA: 10/29/2018 PCP: Patient, No Pcp Per   Brief Narrative:  HPI ON 10/29/2018 by Dr. Thomes Lolling Colleen Smith is a 58 y.o. female with medical history significant of depression presenting to the hospital for evaluation of generalized weakness and vomiting.  Patient reports 3-day history of generalized weakness, nausea, vomiting, and frontal headaches.  Denies any abdominal pain or diarrhea.  States she has not been able to tolerate p.o. intake.  States the muscles in both of her legs have been hurting for the past few days.  Denies focal weakness.  Denies seizures.  She thinks she may have had fevers at home but did not check her temperature.  Denies any suprapubic pain, dysuria, urinary frequency, or urgency.  Denies cough, shortness of breath, or chest pain.  Interim history Admitted with hyponatremia. Nephrology consulted, started patient on 3% saline. Sodium has improved. Cortisol levels were very low, started steroids.   Assessment & Plan   Symptomatic severe hyponatremia -Presented with generalized weakness, dizziness and blurry vision -Sodium on admission 111, currently up to 130 -CT head unremarkable, chest x-ray unremarkable for infection -TSH <0.09, obtain free T4 -Discontinue Zoloft as this can also lead to hyponatremia (although patient has been on this for several years) -Patient currently appears to be euvolemic -Nephrology consulted and appreciated.  Discussed with Dr. Jonnie Finner, placed on 3%-has now been discontinued -A.m. cortisol as well as ACTH obtained.  A.m. cortisol was low, ACTH stimulation test was positive however with minimal elevation. -Supsect secondary to adrenal insufficiency and pituitary causes -Continue to monitor BMP  Viral gastroenteritis -resolved -Currently Afebrile and has no leukocytosis -Lipase and LFTs within normal limits -Complains of abdominal pain -Abdominal exam  benign  Hypomagnesemia -resolved with repletion   Asymptomatic bacteriuria -Currently, patient afebrile and no leukocytosis.   -UA showed moderate leukocytes, 11-20 WBCs, rare bacteria, and negative nitrite.  -denies any dysuria  -Urine culture showed no growth  Abnormal thyroid studies/hypothyroidism- ?  Central cause -TSH very low at 0.09. -FT4 0.35 -Cortisol <0.4 on 4/15 -ACTH stimulation test did show improvement with administration however continue to be low. -Discussed with Dr. Buddy Duty, endocrinology, via phone on 10/31/2018.  Agreed with starting steroids.  Recommended delaying the start of Synthroid for a couple days after starting steroids.  Will follow-up with patient in the office. -Obtained MRI with and without contrast, focusing on the pituitary gland: 1 x 1 x 13 mm pituitary lesion with enhancing rim and internal nonenhancing fluid, most consistent with necrotic pituitary adenoma. -Discussed with neurosurgery, Dr. Vertell Limber, recommended outpatient follow up and repeat imaging in a few months. No indication for surgery at this time. -Will start synthroid on 11/03/2018  Generalized weakness -likely secondary to hyponatremia vs underlying thyroid issue -currently no source of infection  -PT/ OT recommended home health -Vitamin D level 36.5   Myalgia Patient reports soreness of all muscle groups in her bilateral lower extremities.  Not on a statin.   -CK 290 -TSH very low at 0.09. -Likely secondary to the above  Headaches likely secondary to severe hyponatremia and acute sinusitis -headaches for the past 3 days, frontal -Neuro exam nonfocal -Head CT unremarkable for acute intracranial finding. Near pleat opacification of the left maxillary sinus -Continue pain control, Flonase, and treatment of hyponatremia as above  Depression -Continue Zoloft  DVT Prophylaxis  lovenox  Code Status: Full  Family Communication: None at bedside  Disposition Plan: Admitted. Likely  discharge to  home on 4/19  Consultants Nephrology Endocrinology, via phone only, Dr. Buddy Duty  Procedures  None  Antibiotics   Anti-infectives (From admission, onward)   None      Subjective:   Colleen Smith seen and examined today.  Patient feeling better today.  Has some weakness but feels improved compared to previous days.  Denies current chest pain, shortness of breath, abdominal pain, nausea vomiting, diarrhea constipation, or headache. Objective:   Vitals:   11/02/18 0400 11/02/18 0455 11/02/18 0733 11/02/18 0800  BP: (!) 102/58 100/83  119/79  Pulse: 71 68 61 72  Resp:  15 16 18   Temp: 98.1 F (36.7 C)     TempSrc: Oral     SpO2: 92% 98% 94% 94%  Weight:      Height:        Intake/Output Summary (Last 24 hours) at 11/02/2018 0900 Last data filed at 11/02/2018 0839 Gross per 24 hour  Intake 960 ml  Output 350 ml  Net 610 ml   Filed Weights   10/29/18 1831  Weight: 86.2 kg   Exam  General: Well developed, well nourished, NAD, appears stated age  58: NCAT, mucous membranes moist.   Cardiovascular: S1 S2 auscultated, RRR, no murmur   Respiratory: Clear to auscultation bilaterally  Abdomen: Soft, nontender, nondistended, + bowel sounds  Extremities: warm dry without cyanosis clubbing or edema  Neuro: AAOx3, nonfocal  Psych: Pleasant, appropriate mood and affect  Data Reviewed: I have personally reviewed following labs and imaging studies  CBC: Recent Labs  Lab 10/29/18 1841 10/31/18 0613  WBC 6.5 5.0  NEUTROABS 2.2  --   HGB 13.7 13.2  HCT 37.3 37.1  MCV 80.7 83.4  PLT 287 408   Basic Metabolic Panel: Recent Labs  Lab 10/29/18 1841 10/29/18 2250 10/30/18 0415  10/31/18 0613  11/01/18 0113 11/01/18 0528 11/01/18 1426 11/01/18 2001 11/02/18 0303  NA 111* 111* 111*   < > 119*   < > 123* 127* 128* 129* 130*  K 3.5 3.7 5.1  --  4.1  --   --  4.5  --   --  4.3  CL 78* 81* 82*  --  92*  --   --  97*  --   --  99  CO2 21* 21* 18*  --   19*  --   --  21*  --   --  23  GLUCOSE 75 72 56*  --  78  --   --  65*  --   --  74  BUN 6 7 <5*  --  <5*  --   --  <5*  --   --  6  CREATININE 0.90 0.85 0.92  --  0.86  --   --  0.86  --   --  0.99  CALCIUM 8.4* 7.8* 7.6*  --  7.9*  --   --  8.5*  --   --  9.1  MG 1.5*  --  2.0  --   --   --   --   --   --   --   --   PHOS  --   --  2.7  --   --   --   --   --   --   --   --    < > = values in this interval not displayed.   GFR: Estimated Creatinine Clearance: 71.2 mL/min (by C-G formula based on SCr of 0.99 mg/dL). Liver Function Tests:  Recent Labs  Lab 10/29/18 1841  AST 40  ALT 24  ALKPHOS 54  BILITOT 0.5  PROT 6.9  ALBUMIN 3.7   Recent Labs  Lab 10/29/18 1841  LIPASE 38   No results for input(s): AMMONIA in the last 168 hours. Coagulation Profile: No results for input(s): INR, PROTIME in the last 168 hours. Cardiac Enzymes: Recent Labs  Lab 10/29/18 1857 10/30/18 0030  CKTOTAL  --  290*  TROPONINI <0.03  --    BNP (last 3 results) No results for input(s): PROBNP in the last 8760 hours. HbA1C: No results for input(s): HGBA1C in the last 72 hours. CBG: Recent Labs  Lab 10/30/18 0606 10/30/18 0838  GLUCAP 78 87   Lipid Profile: No results for input(s): CHOL, HDL, LDLCALC, TRIG, CHOLHDL, LDLDIRECT in the last 72 hours. Thyroid Function Tests: No results for input(s): TSH, T4TOTAL, FREET4, T3FREE, THYROIDAB in the last 72 hours. Anemia Panel: No results for input(s): VITAMINB12, FOLATE, FERRITIN, TIBC, IRON, RETICCTPCT in the last 72 hours. Urine analysis:    Component Value Date/Time   COLORURINE STRAW (A) 10/29/2018 1959   APPEARANCEUR CLEAR 10/29/2018 1959   LABSPEC 1.006 10/29/2018 1959   PHURINE 5.0 10/29/2018 1959   GLUCOSEU NEGATIVE 10/29/2018 1959   HGBUR NEGATIVE 10/29/2018 [place] NEGATIVE 10/29/2018 1959   KETONESUR 20 (A) 10/29/2018 1959   PROTEINUR NEGATIVE 10/29/2018 1959   NITRITE NEGATIVE 10/29/2018 1959   LEUKOCYTESUR  MODERATE (A) 10/29/2018 1959   Sepsis Labs: @LABRCNTIP (procalcitonin:4,lacticidven:4)  ) Recent Results (from the past 240 hour(s))  Urine culture     Status: None   Collection Time: 10/29/18  7:59 PM  Result Value Ref Range Status   Specimen Description   Final    URINE, RANDOM Performed at [facility], [place] [address], [place], [place] [address]    Special Requests   Final    NONE Performed at [facility], [place] [address], [place], [place] [address]    Culture   Final    NO GROWTH Performed at [facility], [place] [address] [place], [place] [address]    Report Status 10/30/2018 FINAL  Final  MRSA PCR Screening     Status: None   Collection Time: 10/30/18  3:10 AM  Result Value Ref Range Status   MRSA by PCR NEGATIVE NEGATIVE Final    Comment:        The GeneXpert MRSA Assay (FDA approved for NASAL specimens only), is one component of a comprehensive MRSA colonization surveillance program. It is not intended to diagnose MRSA infection nor to guide or monitor treatment for MRSA infections. Performed at [facility], [place] [address], [place], [place] [address]       Radiology Studies: Mr Jeri Cos RD Contrast  Result Date: 11/01/2018 CLINICAL DATA:  Pituitary/endocrine dysfunction. Nausea and vomiting. EXAM: MRI HEAD WITHOUT AND WITH CONTRAST TECHNIQUE: Multiplanar, multiecho pulse sequences of the brain and surrounding structures were obtained without and with intravenous contrast. CONTRAST:  7.5 cc Gadavist COMPARISON:  Head CT 10/29/2018 and 10/06/2009. FINDINGS: Brain: Brain parenchyma itself does not show any acute or subacute infarction. Brainstem and cerebellum are normal. 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 There is mild chronic gliosis and volume loss in the deep insula and left  parietal lobe consistent with an old ischemic insult in this region. There is  no evidence of intra-axial mass lesion, hemorrhage, hydrocephalus or extra-axial collection. There is a cystic or necrotic lesion within the pituitary gland measuring a cm in size. Along the anterior margin, this has a bifid extension which measures right-to-left up to 13 mm. There does appear to be some enhancing pituitary tissue around the margins. 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 There is no T1 hyperintensity precontrast or apparent susceptibility to suggest pituitary apoplexy/hemorrhage, but that is not completely excluded. Primary pituitary abscess is quite rare. Vascular: Major vessels at the base of the brain show flow. Skull and upper cervical spine: Negative Sinuses/Orbits: Acute left maxillary sinusitis.  Orbits negative. 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 See above discussion. The differential diagnosis does include atypical Rathke's cleft cyst, pituitary apoplexy and pituitary abscess, but those are not favored. Electronically Signed   By: Nelson Chimes M.D.   On: 11/01/2018 08:55     Scheduled Meds:  Chlorhexidine Gluconate Cloth  6 each Topical Daily   enoxaparin (LOVENOX) injection  40 mg Subcutaneous Daily   fluticasone  1 spray Each Nare Daily   hydrocortisone  15 mg Oral QAC breakfast   hydrocortisone  5 mg Oral Daily   sertraline  50 mg Oral Daily   Continuous Infusions:    LOS: 4 days   Time Spent in minutes   30 minutes  Alejos Reinhardt D.O. on 11/02/2018 at 9:00 AM  Between 7am to 7pm - Please see pager noted on [email or web address]  After 7pm go to [email or web address]  And look for the night coverage person covering for me after hours  [facility] Office  [phone]

## 2018-11-02 NOTE — Progress Notes (Signed)
Physical Therapy Treatment Patient Details Name: Colleen Smith MRN: [id number] DOB: Jan 06, 1961 Today's Date: 11/02/2018    History of Present Illness 58 YO female admitted 10/29/18 with weakness, dehydration, hyponatremia. H/O depression     PT Comments    Pt continues cooperative but limited this am by fatigue and c/o lightheadedness with movement - pt states "they started me on new meds this morning, I think that's whats causing it".  Follow Up Recommendations  Home health PT     Equipment Recommendations  Rolling walker with 5" wheels    Recommendations for Other Services OT consult     Precautions / Restrictions Precautions Precautions: Fall Precaution Comments: Na  low Restrictions Weight Bearing Restrictions: No    Mobility  Bed Mobility Overal bed mobility: Needs Assistance Bed Mobility: Supine to Sit;Sit to Supine Rolling: Supervision Sidelying to sit: Supervision Supine to sit: Supervision Sit to supine: Supervision      Transfers Overall transfer level: Needs assistance Equipment used: Rolling walker (2 wheeled) Transfers: Sit to/from Stand Sit to Stand: Min guard         General transfer comment: cues for use of UEs to self assist  Ambulation/Gait Ambulation/Gait assistance: Min assist;Min guard Gait Distance (Feet): 200 Feet Assistive device: Rolling walker (2 wheeled) Gait Pattern/deviations: Step-through pattern;Decreased step length - right;Decreased step length - left;Shuffle;Trunk flexed     General Gait Details: cues for posture and position from RW; 1 seated rest break 2* c/o lightheadedness and 2 standing rest breaks 2* elevated HR - RN aware   Stairs             Wheelchair Mobility    Modified Rankin (Stroke Patients Only)       Balance Overall balance assessment: Needs assistance Sitting-balance support: No upper extremity supported;Feet supported Sitting balance-Leahy Scale: Good     Standing balance support:  During functional activity;Bilateral upper extremity supported Standing balance-Leahy Scale: Fair                              Cognition Arousal/Alertness: Awake/alert Behavior During Therapy: WFL for tasks assessed/performed;Flat affect Overall Cognitive Status: Within Functional Limits for tasks assessed                                        Exercises      General Comments        Pertinent Vitals/Pain Pain Assessment: Faces Faces Pain Scale: Hurts little more Pain Location: legs Pain Descriptors / Indicators: Aching Pain Intervention(s): Limited activity within patient's tolerance;Monitored during session;Premedicated before session    Home Living                      Prior Function            PT Goals (current goals can now be found in the care plan section) Acute Rehab PT Goals Patient Stated Goal: to go home PT Goal Formulation: With patient Time For Goal Achievement: 11/14/18 Potential to Achieve Goals: Good Progress towards PT goals: Progressing toward goals    Frequency    Min 3X/week      PT Plan Current plan remains appropriate    Co-evaluation              AM-PAC PT "6 Clicks" Mobility   Outcome Measure  Help needed turning from your back to  your side while in a flat bed without using bedrails?: None Help needed moving from lying on your back to sitting on the side of a flat bed without using bedrails?: None Help needed moving to and from a bed to a chair (including a wheelchair)?: A Little Help needed standing up from a chair using your arms (e.g., wheelchair or bedside chair)?: A Little Help needed to walk in hospital room?: A Little Help needed climbing 3-5 steps with a railing? : A Little 6 Click Score: 20    End of Session Equipment Utilized During Treatment: Gait belt Activity Tolerance: Patient tolerated treatment well Patient left: in bed;with call bell/phone within reach;with bed alarm  set Nurse Communication: Mobility status PT Visit Diagnosis: Unsteadiness on feet (R26.81)     Time: 1000-1020 PT Time Calculation (min) (ACUTE ONLY): 20 min  Charges:  $Gait Training: 8-22 mins                     [place] Pager [phone] Office [phone]    Ayub Kirsh 11/02/2018, 10:45 AM

## 2018-11-02 NOTE — Plan of Care (Signed)
  Problem: Clinical Measurements: Goal: Diagnostic test results will improve Outcome: Progressing   Problem: Clinical Measurements: Goal: Respiratory complications will improve Outcome: Progressing   Problem: Clinical Measurements: Goal: Cardiovascular complication will be avoided Outcome: Progressing   Problem: Nutrition: Goal: Adequate nutrition will be maintained Outcome: Progressing   Problem: Coping: Goal: Level of anxiety will decrease Outcome: Progressing   

## 2018-11-02 NOTE — Progress Notes (Signed)
Pt to room 6986 with no complication via wheelchair. CNA took over pt care on 5E.

## 2018-11-02 NOTE — Progress Notes (Signed)
Patient requested med for sleep. Paged [facility]. One-time order for 5mg  ambien. Will continue to monitor.

## 2018-11-03 LAB — BASIC METABOLIC PANEL
Anion gap: 9 (ref 5–15)
BUN: 8 mg/dL (ref 6–20)
CO2: 25 mmol/L (ref 22–32)
Calcium: 9.2 mg/dL (ref 8.9–10.3)
Chloride: 98 mmol/L (ref 98–111)
Creatinine, Ser: 1.04 mg/dL — ABNORMAL HIGH (ref 0.44–1.00)
GFR calc Af Amer: >60 mL/min (ref >60)
GFR calc non Af Amer: 59 mL/min — ABNORMAL LOW (ref >60)
Glucose, Bld: 62 mg/dL — ABNORMAL LOW (ref 70–99)
Potassium: 4.1 mmol/L (ref 3.5–5.1)
Sodium: 132 mmol/L — ABNORMAL LOW (ref 135–145)

## 2018-11-03 LAB — MAGNESIUM: Magnesium: 1.9 mg/dL (ref 1.7–2.4)

## 2018-11-03 LAB — CK: Total CK: 96 U/L (ref 38–234)

## 2018-11-03 MED ORDER — POLYVINYL ALCOHOL 1.4 % OP SOLN
1.0000 [drp] | OPHTHALMIC | Status: DC | PRN
  Administered 2018-11-03: 21:00:00 2 [drp] via OPHTHALMIC
  Filled 2018-11-03: qty 15

## 2018-11-03 MED ORDER — LEVOTHYROXINE SODIUM 25 MCG PO TABS: 25.0000 ug | ORAL_TABLET | Freq: Every day | ORAL | Status: DC

## 2018-11-03 MED ORDER — LORATADINE 10 MG PO TABS
10.0000 mg | ORAL_TABLET | Freq: Every day | ORAL | Status: DC
  Administered 2018-11-03 – 2018-11-04 (×2): 10 mg via ORAL
  Filled 2018-11-03 (×2): qty 1

## 2018-11-03 MED ORDER — BUPROPION HCL ER (XL) 300 MG PO TB24
300.0000 mg | ORAL_TABLET | Freq: Every day | ORAL | Status: DC
  Administered 2018-11-03 – 2018-11-04 (×2): 300 mg via ORAL
  Filled 2018-11-03 (×2): qty 1

## 2018-11-03 MED ORDER — LEVOTHYROXINE SODIUM 25 MCG PO TABS
25.0000 ug | ORAL_TABLET | Freq: Every day | ORAL | Status: DC
  Administered 2018-11-03 – 2018-11-04 (×2): 25 ug via ORAL
  Filled 2018-11-03 (×2): qty 1

## 2018-11-03 NOTE — Progress Notes (Signed)
PROGRESS NOTE    Colleen Smith  XTG:[id number] DOB: Nov 04, 1960 DOA: 10/29/2018 PCP: Patient, No Pcp Per   Brief Narrative:  HPI ON 10/29/2018 by Dr. Thomes Lolling Colleen Smith is a 58 y.o. female with medical history significant of depression presenting to the hospital for evaluation of generalized weakness and vomiting.  Patient reports 3-day history of generalized weakness, nausea, vomiting, and frontal headaches.  Denies any abdominal pain or diarrhea.  States she has not been able to tolerate p.o. intake.  States the muscles in both of her legs have been hurting for the past few days.  Denies focal weakness.  Denies seizures.  She thinks she may have had fevers at home but did not check her temperature.  Denies any suprapubic pain, dysuria, urinary frequency, or urgency.  Denies cough, shortness of breath, or chest pain.  Interim history Admitted with hyponatremia. Nephrology consulted, started patient on 3% saline. Sodium has improved. Cortisol levels were very low, started steroids.   Assessment & Plan   Symptomatic severe hyponatremia -Presented with generalized weakness, dizziness and blurry vision -Sodium on admission 111, currently up to 132 -CT head unremarkable, chest x-ray unremarkable for infection -TSH <0.09 -Discontinue Zoloft as this can also lead to hyponatremia (although patient has been on this for several years) -Patient currently appears to be euvolemic -Nephrology consulted and appreciated.  Discussed with Dr. Jonnie Finner, placed on 3%-has now been discontinued -A.m. cortisol as well as ACTH obtained.  A.m. cortisol was low, ACTH stimulation test was positive however with minimal elevation. -Supsect secondary to adrenal insufficiency and pituitary causes -Continue to monitor BMP  Viral gastroenteritis -resolved -Currently Afebrile and has no leukocytosis -Lipase and LFTs within normal limits -Complains of abdominal pain -Abdominal exam benign  Hypomagnesemia  -resolved with repletion   Asymptomatic bacteriuria -Currently, patient afebrile and no leukocytosis.   -UA showed moderate leukocytes, 11-20 WBCs, rare bacteria, and negative nitrite.  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 -FT4 0.35 -Cortisol <0.4 on 4/15 -ACTH stimulation test did show improvement with administration however continue to be low. -Discussed with Dr. Buddy Duty, endocrinology, via phone on 10/31/2018.  Agreed with starting steroids.  Recommended delaying the start of Synthroid for a couple days after starting steroids.  Will follow-up with patient in the office. -Obtained MRI with and without contrast, focusing on the pituitary gland: 1 x 1 x 13 mm pituitary lesion with enhancing rim and internal nonenhancing fluid, most consistent with necrotic pituitary adenoma. -Discussed with neurosurgery, Dr. Vertell Limber, recommended outpatient follow up and repeat imaging in a few months. No indication for surgery at this time.  -LH/FSH, ILGF pending, GH 0.1 -will start patient on synthroid today, 61mcg  -she will need to have repeat TFTs in 4 weeks  Generalized weakness -likely secondary to hyponatremia vs underlying thyroid issue -currently no source of infection  -PT/ OT recommended home health -Vitamin D level 36.5   Myalgia/ Lower extremity pain -Patient reported soreness of all muscle groups in her bilateral lower extremities.  Not on a statin.   -CK 290- repeat pending today -TSH very low at 0.09. -Likely secondary to the above -continue pain control and OOB  Headaches likely secondary to severe hyponatremia and acute sinusitis -headaches for the past 3 days, frontal -Neuro exam nonfocal -Head CT unremarkable for acute intracranial finding. Near pleat opacification of the left maxillary sinus -Continue pain control, Flonase, and treatment of hyponatremia  as  above -improving   Depression -Continue Zoloft -will restart wellbutrin   DVT Prophylaxis  lovenox  Code Status: Full  Family Communication: None at bedside  Disposition Plan: Admitted. Likely discharge to home on 4/20 with home health PT/OT  Consultants Nephrology Endocrinology, via phone only, Dr. Buddy Duty  Procedures  None  Antibiotics   Anti-infectives (From admission, onward)   None      Subjective:   Colleen Smith seen and examined today.  Patient feeling better today.  Continues to have weakness in her legs.  Denies current chest pain, shortness of breath, abdominal pain, nausea or vomiting, diarrhea or constipation, dizziness or headache. Objective:   Vitals:   11/02/18 1700 11/02/18 1805 11/02/18 2028 11/03/18 0540  BP:  136/83 (!) 127/97 121/70  Pulse: (!) 51 63 67 (!) 54  Resp: 13 20 18 20   Temp:  97.7 F (36.5 C) 97.9 F (36.6 C) 97.7 F (36.5 C)  TempSrc:  Oral Oral Oral  SpO2: 98% 94% 98% 99%  Weight:      Height:        Intake/Output Summary (Last 24 hours) at 11/03/2018 1016 Last data filed at 11/03/2018 0830 Gross per 24 hour  Intake 1440 ml  Output -  Net 1440 ml   Filed Weights   10/29/18 1831  Weight: 86.2 kg   Exam  General: Well developed, well nourished, NAD, appears stated age  60: NCAT, mucous membranes moist.   Cardiovascular: S1 S2 auscultated, RRR, no murmur  Respiratory: Clear to auscultation bilaterally with equal chest rise  Abdomen: Soft, nontender, nondistended, + bowel sounds  Extremities: warm dry without cyanosis clubbing or edema  Neuro: AAOx3, nonfocal  Psych: Pleasant, appropriate mood and affect  Data Reviewed: I have personally reviewed following labs and imaging studies  CBC: Recent Labs  Lab 10/29/18 1841 10/31/18 0613  WBC 6.5 5.0  NEUTROABS 2.2  --   HGB 13.7 13.2  HCT 37.3 37.1  MCV 80.7 83.4  PLT 287 811   Basic Metabolic Panel: Recent Labs  Lab 10/29/18 1841  10/30/18 0415   10/31/18 0613  11/01/18 0528 11/01/18 1426 11/01/18 2001 11/02/18 0303 11/03/18 0521  NA 111*   < > 111*   < > 119*   < > 127* 128* 129* 130* 132*  K 3.5   < > 5.1  --  4.1  --  4.5  --   --  4.3 4.1  CL 78*   < > 82*  --  92*  --  97*  --   --  99 98  CO2 21*   < > 18*  --  19*  --  21*  --   --  23 25  GLUCOSE 75   < > 56*  --  78  --  65*  --   --  74 62*  BUN 6   < > <5*  --  <5*  --  <5*  --   --  6 8  CREATININE 0.90   < > 0.92  --  0.86  --  0.86  --   --  0.99 1.04*  CALCIUM 8.4*   < > 7.6*  --  7.9*  --  8.5*  --   --  9.1 9.2  MG 1.5*  --  2.0  --   --   --   --   --   --   --   --   PHOS  --   --  2.7  --   --   --   --   --   --   --   --    < > = values in this interval not displayed.   GFR: Estimated Creatinine Clearance: 67.8 mL/min (A) (by C-G formula based on SCr of 1.04 mg/dL (H)). Liver Function Tests: Recent Labs  Lab 10/29/18 1841  AST 40  ALT 24  ALKPHOS 54  BILITOT 0.5  PROT 6.9  ALBUMIN 3.7   Recent Labs  Lab 10/29/18 1841  LIPASE 38   No results for input(s): AMMONIA in the last 168 hours. Coagulation Profile: No results for input(s): INR, PROTIME in the last 168 hours. Cardiac Enzymes: Recent Labs  Lab 10/29/18 1857 10/30/18 0030  CKTOTAL  --  290*  TROPONINI <0.03  --    BNP (last 3 results) No results for input(s): PROBNP in the last 8760 hours. HbA1C: No results for input(s): HGBA1C in the last 72 hours. CBG: Recent Labs  Lab 10/30/18 0606 10/30/18 0838  GLUCAP 78 87   Lipid Profile: No results for input(s): CHOL, HDL, LDLCALC, TRIG, CHOLHDL, LDLDIRECT in the last 72 hours. Thyroid Function Tests: No results for input(s): TSH, T4TOTAL, FREET4, T3FREE, THYROIDAB in the last 72 hours. Anemia Panel: No results for input(s): VITAMINB12, FOLATE, FERRITIN, TIBC, IRON, RETICCTPCT in the last 72 hours. Urine analysis:    Component Value Date/Time   COLORURINE STRAW (A) 10/29/2018 1959   APPEARANCEUR CLEAR 10/29/2018 1959    LABSPEC 1.006 10/29/2018 1959   PHURINE 5.0 10/29/2018 1959   GLUCOSEU NEGATIVE 10/29/2018 1959   HGBUR NEGATIVE 10/29/2018 [place] NEGATIVE 10/29/2018 1959   KETONESUR 20 (A) 10/29/2018 1959   PROTEINUR NEGATIVE 10/29/2018 1959   NITRITE NEGATIVE 10/29/2018 1959   LEUKOCYTESUR MODERATE (A) 10/29/2018 1959   Sepsis Labs: @LABRCNTIP (procalcitonin:4,lacticidven:4)  ) Recent Results (from the past 240 hour(s))  Urine culture     Status: None   Collection Time: 10/29/18  7:59 PM  Result Value Ref Range Status   Specimen Description   Final    URINE, RANDOM Performed at [facility], [place] [address] [place], [place] [address]    Special Requests   Final    NONE Performed at [facility], [place] [address], [place], [place] [address]    Culture   Final    NO GROWTH Performed at [facility], [place] [address] [place], [place] [address]    Report Status 10/30/2018 FINAL  Final  MRSA PCR Screening     Status: None   Collection Time: 10/30/18  3:10 AM  Result Value Ref Range Status   MRSA by PCR NEGATIVE NEGATIVE Final    Comment:        The GeneXpert MRSA Assay (FDA approved for NASAL specimens only), is one component of a comprehensive MRSA colonization surveillance program. It is not intended to diagnose MRSA infection nor to guide or monitor treatment for MRSA infections. Performed at [facility], [place] [address], [place], [place] [address]       Radiology Studies: No results found.   Scheduled Meds: . buPROPion  300 mg Oral Daily  . enoxaparin (LOVENOX) injection  40 mg Subcutaneous Daily  . fluticasone  1 spray Each Nare Daily  . hydrocortisone  15 mg Oral QAC breakfast  . hydrocortisone  5 mg Oral Daily  . levothyroxine  25 mcg Oral Q0600  . loratadine  10 mg Oral Daily  .  sertraline  50 mg Oral Daily   Continuous Infusions:    LOS: 5 days   Time Spent in minutes   30 minutes   Lekeshia Kram D.O. on 11/03/2018 at 10:16 AM  Between 7am to 7pm - Please see pager noted on [email or web address]  After 7pm go to [email or web address]  And look for the night coverage person covering for me after hours  [facility] Office  [phone]

## 2018-11-03 NOTE — Progress Notes (Signed)
PT Cancellation Note  Patient Details Name: Colleen Smith MRN: [id number] DOB: September 21, 1960   Cancelled Treatment:    Reason Eval/Treat Not Completed: Other (comment)  Pt sleeping on arrival and easily awakened.  Pt reports she didn't sleep last night and requests to rest.  Pt reports overall feeling better and states she will d/c home tomorrow.  Maxtyn Nuzum,KATHrine E 11/03/2018, 2:51 PM  Carmelia Bake, PT, DPT Acute Rehabilitation Services Office: [phone] Pager: [phone]

## 2018-11-03 NOTE — Progress Notes (Signed)
Occupational Therapy Treatment Patient Details Name: Colleen Smith MRN: [id number] DOB: 07-02-1961 Today's Date: 11/03/2018    History of present illness 58 YO female admitted 10/29/18 with weakness, dehydration, hyponatremia. H/O depression    OT comments  Pt much improved this day and plans to DC home tomorrow  Follow Up Recommendations  Home health OT    Equipment Recommendations  None recommended by OT(tba further; has standard commode)    Recommendations for Other Services      Precautions / Restrictions Precautions Precautions: Fall       Mobility Bed Mobility Overal bed mobility: Needs Assistance Bed Mobility: Supine to Sit Rolling: Supervision Sidelying to sit: Supervision          Transfers Overall transfer level: Needs assistance Equipment used: Rolling walker (2 wheeled) Transfers: Sit to/from [facility] Sit to Stand: Min guard Stand pivot transfers: Min guard       General transfer comment: cues for use of UEs to self assist    Balance Overall balance assessment: Needs assistance Sitting-balance support: No upper extremity supported;Feet supported Sitting balance-Leahy Scale: Good     Standing balance support: During functional activity;Bilateral upper extremity supported Standing balance-Leahy Scale: Fair                             ADL either performed or assessed with clinical judgement   ADL Overall ADL's : Needs assistance/impaired Eating/Feeding: Independent   Grooming: Set up;Standing   Upper Body Bathing: Set up;Sitting   Lower Body Bathing: Min guard;Sit to/from stand;Cueing for sequencing;Cueing for safety   Upper Body Dressing : Set up;Sitting   Lower Body Dressing: Min guard;Cueing for sequencing;Sit to/from stand;Cueing for safety   Toilet Transfer: Supervision/safety;Cueing for safety;Cueing for sequencing;RW   Toileting- Clothing Manipulation and Hygiene: Set up;Sit to/from stand;Cueing for  sequencing;Cueing for safety       Functional mobility during ADLs: Cueing for sequencing;Cueing for safety;Supervision/safety                 Cognition Arousal/Alertness: Awake/alert Behavior During Therapy: [facility] for tasks assessed/performed;Flat affect Overall Cognitive Status: Within Functional Limits for tasks assessed                                                     Pertinent Vitals/ Pain       Pain Assessment: No/denies pain     Prior Functioning/Environment              Frequency  Min 2X/week        Progress Toward Goals  OT Goals(current goals can now be found in the care plan section)  Progress towards OT goals: Progressing toward goals     Plan Discharge plan remains appropriate       AM-PAC OT "6 Clicks" Daily Activity     Outcome Measure   Help from another person eating meals?: None Help from another person taking care of personal grooming?: A Little Help from another person toileting, which includes using toliet, bedpan, or urinal?: A Little Help from another person bathing (including washing, rinsing, drying)?: A Little Help from another person to put on and taking off regular upper body clothing?: A Little Help from another person to put on and taking off regular lower body clothing?: A Little 6 Click Score:  19    End of Session    OT Visit Diagnosis: Unsteadiness on feet (R26.81);Muscle weakness (generalized) (M62.81)   Activity Tolerance Patient tolerated treatment well   Patient Left in chair;with call bell/phone within reach;with chair alarm set   Nurse Communication          Time: 8569-4370 OT Time Calculation (min): 9 min  Charges: OT General Charges $OT Visit: 1 Visit OT Treatments $Self Care/Home Management : 8-22 mins  Kari Baars, [place] [phone] Office- [place], Edwena Felty D 11/03/2018, 1:39 PM

## 2018-11-04 LAB — BASIC METABOLIC PANEL
Anion gap: 9 (ref 5–15)
BUN: 10 mg/dL (ref 6–20)
CO2: 26 mmol/L (ref 22–32)
Calcium: 9.1 mg/dL (ref 8.9–10.3)
Chloride: 99 mmol/L (ref 98–111)
Creatinine, Ser: 1.13 mg/dL — ABNORMAL HIGH (ref 0.44–1.00)
GFR calc Af Amer: >60 mL/min (ref >60)
GFR calc non Af Amer: 54 mL/min — ABNORMAL LOW (ref >60)
Glucose, Bld: 70 mg/dL (ref 70–99)
Potassium: 4 mmol/L (ref 3.5–5.1)
Sodium: 134 mmol/L — ABNORMAL LOW (ref 135–145)

## 2018-11-04 LAB — FOLLICLE STIMULATING HORMONE: FSH: 4.4 m[IU]/mL

## 2018-11-04 LAB — LUTEINIZING HORMONE: LH: 0.7 m[IU]/mL

## 2018-11-04 MED ORDER — LEVOTHYROXINE SODIUM 25 MCG PO TABS: 25.0000 ug | ORAL_TABLET | Freq: Every day | ORAL | 0 refills | Status: AC

## 2018-11-04 MED ORDER — TRAMADOL HCL 50 MG PO TABS: 50.0000 mg | ORAL_TABLET | Freq: Four times a day (QID) | ORAL | 0 refills | Status: AC | PRN

## 2018-11-04 MED ORDER — HYDROCORTISONE 5 MG PO TABS: ORAL_TABLET | ORAL | 0 refills | Status: AC

## 2018-11-04 MED ORDER — LORATADINE 10 MG PO TABS: 10.0000 mg | ORAL_TABLET | Freq: Every day | ORAL | Status: AC

## 2018-11-04 NOTE — Discharge Summary (Signed)
Physician Discharge Summary  Colleen Smith ONG:[id number] DOB: 06-02-1961 DOA: 10/29/2018  PCP: Patient, No Pcp Per  Admit date: 10/29/2018 Discharge date: 11/04/2018  Time spent: 45 minutes  Recommendations for Outpatient Follow-up:  Patient will be discharged to home with home health physical and occupational therapy.   Patient will need to follow up with primary care provider within one week of discharge. Follow up with endocrinology, Dr. Buddy Duty. Follow up with Dr. Vertell Limber, neurosurgery. Patient should continue medications as prescribed.  Patient should follow a regular diet.   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 Myrtie Soman Pinedais a 58 y.o.femalewith medical history significant ofdepression presenting to the hospital for evaluation of generalized weakness and vomiting. Patient reports 3-day history of generalized weakness, nausea, vomiting, and frontal headaches. Denies any abdominal pain or diarrhea. States she has not been able to tolerate p.o. intake. States the muscles in both of her legs have been hurting for the past few days. Denies focal weakness. Denies seizures. She thinks she may have had fevers at home but did not check her temperature. Denies any suprapubic pain, dysuria, urinary frequency, or urgency. Denies cough, shortness of breath, or chest pain.  Hospital Course:  Symptomatic severe hyponatremia -Presented with generalized weakness, dizziness and blurry vision -Sodium on  admission 111, currently up to 134 -CT head unremarkable, chest x-ray unremarkable for infection -TSH <0.09 -Discontinue Zoloft as this can also lead to hyponatremia (although patient has been on this for several years) -Patient currently appears to be euvolemic -Nephrology consulted and appreciated.  Was placed on 3% saline for a brief period of time -A.m. cortisol as well as ACTH obtained.  A.m. cortisol was low, ACTH stimulation test was positive however with minimal elevation. -Supsect secondary to adrenal insufficiency and pituitary causes -repeat BMP in one week  Viral gastroenteritis -resolved -Currently Afebrile and has no leukocytosis -Lipase and LFTs within normal limits -Complains of abdominal pain -Abdominal exam benign  Hypomagnesemia -resolved with repletion   Asymptomatic bacteriuria -Currently, patient afebrile and no leukocytosis.  -UA showed moderate leukocytes, 11-20 WBCs, rare bacteria, and negative nitrite.  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 -FT4 0.35 -Cortisol <0.4 on 4/15 -ACTH stimulation test did show improvement with administration however continue to be low. -Discussed with Dr. Buddy Duty, endocrinology, via phone on 10/31/2018.  Agreed with starting steroids.  Recommended delaying the start of Synthroid for a couple days after starting steroids.  Will follow-up with patient in the office. -Obtained MRI with and without contrast, focusing on the pituitary gland: 1 x 1 x 13 mm pituitary lesion with enhancing rim and internal nonenhancing fluid, most consistent with necrotic pituitary adenoma. -Discussed with neurosurgery, Dr. Vertell Limber, recommended outpatient follow up and repeat imaging in a few months. No indication for surgery at this time.  -Continue synthroid, 57mcg  -she will need to have repeat TFTs in 4 weeks  Generalized  weakness -likely secondary to hyponatremia vs underlying thyroid issue -currently no source of infection  -PT/ OT recommended home health -Vitamin D level 36.5   Myalgia/  Lower extremity pain -Patient reported soreness of all muscle groups in her bilateral lower extremities.Not on a statin.  -CK 290 on admission, trended down to 96 -TSH very low at 0.09. -Likely secondary to the above -continue pain control and OOB  Headacheslikely secondary to severe hyponatremia andacute sinusitis -headaches for the past 3 days, frontal -Neuro exam nonfocal -Head CT unremarkable for acute intracranial finding. Near pleat opacification of the left maxillary sinus -Continue pain control, Flonase, and treatment of hyponatremia as above -improving   Depression -Continue Zoloft and wellbutrin  Consultants Nephrology Endocrinology, via phone only, Dr. Buddy Duty  Procedures  None  Discharge Exam: Vitals:   11/03/18 2032 11/04/18 0507  BP: 110/77 111/72  Pulse: (!) 57 (!) 58  Resp: 16 16  Temp: (!) 97.5 F (36.4 C) 98 F (36.7 C)  SpO2: 99% 96%   Patient feeling better today. No complaints of chest pain, Hartness of breath, abdominal pain, nausea or vomiting, diarrhea constipation, dizziness.  Continues to have some lower extremity weakness.   General: Well developed, well nourished, NAD, appears stated age  3: NCAT, mucous membranes moist.  Neck: Supple  Cardiovascular: S1 S2 auscultated, RRR, no murmur  Respiratory: Clear to auscultation bilaterally   Abdomen: Soft, nontender, nondistended, + bowel sounds  Extremities: warm dry without cyanosis clubbing or edema  Neuro: AAOx3, nonfocal  Psych: Pleasant, appropriate mood and affect  Discharge Instructions Discharge Instructions    Discharge instructions   Complete by:  As directed    Patient will be discharged to home with home health physical and occupational therapy.   Patient will need to follow up with primary  care provider within one week of discharge. Follow up with endocrinology, Dr. Buddy Duty. Follow up with Dr. Vertell Limber, neurosurgery. Patient should continue medications as prescribed.  Patient should follow a regular diet.     Allergies as of 11/04/2018      Reactions   Vicodin [hydrocodone-acetaminophen] Itching   Penicillins Rash   Did it involve swelling of the face/tongue/throat, SOB, or low BP? Yes Did it involve sudden or severe rash/hives, skin peeling, or any reaction on the inside of your mouth or nose? Yes Did you need to seek medical attention at a hospital or doctor's office? No When did it last happen?Childhood rxn If all above answers are NO, may proceed with cephalosporin use.      Medication List    TAKE these medications   ALPRAZolam 0.25 MG tablet Commonly known as:  XANAX Take 0.25 mg by mouth 3 (three) times daily as needed for anxiety.   buPROPion 300 MG 24 hr tablet Commonly known as:  WELLBUTRIN XL Take 300 mg by mouth daily.   hydrocortisone 5 MG tablet Commonly known as:  CORTEF Take 3 tablets in the morning. Then take 1 tablet around 2pm.   levothyroxine 25 MCG tablet Commonly known as:  SYNTHROID Take 1 tablet (25 mcg total) by mouth daily at 6 (six) AM. Start taking on:  November 05, 2018   loratadine 10 MG tablet Commonly known as:  CLARITIN Take 1 tablet (10 mg total) by mouth daily. Start taking on:  November 05, 2018   sertraline 50 MG tablet Commonly known as:  ZOLOFT Take 50 mg by mouth daily.   traMADol 50 MG tablet Commonly known as:  ULTRAM Take 1 tablet (50 mg total) by mouth every 6 (six) hours as needed for moderate pain.      Allergies  Allergen Reactions   Vicodin [Hydrocodone-Acetaminophen] Itching  Penicillins Rash    Did it involve swelling of the face/tongue/throat, SOB, or low BP? Yes Did it involve sudden or severe rash/hives, skin peeling, or any reaction on the inside of your mouth or nose? Yes Did you need to seek  medical attention at a hospital or doctor's office? No When did it last happen?Childhood rxn If all above answers are NO, may proceed with cephalosporin use.   Follow-up Information    Primary care physician. Schedule an appointment as soon as possible for a visit in 1 week(s).   Why:  Hospital follow up       Erline Levine, MD. Schedule an appointment as soon as possible for a visit in 1 month(s).   Specialty:  Neurosurgery Why:  Hospital follow up Contact information: [address] [place] [address] [place] [address] [phone]        Delrae Rend, MD. Schedule an appointment as soon as possible for a visit in 1 week(s).   Specialty:  Endocrinology Why:  Hospital follow up- office will contact you with an appointment Contact information: [address] [place] [address] [phone]            The results of significant diagnostics from this hospitalization (including imaging, microbiology, ancillary and laboratory) are listed below for reference.    Significant Diagnostic Studies: Ct Head Wo Contrast  Result Date: 10/29/2018 CLINICAL DATA:  Headache.  "Malignancy suspected". EXAM: CT HEAD WITHOUT CONTRAST TECHNIQUE: Contiguous axial images were obtained from the base of the skull through the vertex without intravenous contrast. COMPARISON:  10/06/2009 FINDINGS: Brain: Cerebral atrophy, age advanced. This results in prominence of the extra-axial spaces, primarily adjacent the frontal lobes. Mild degradation secondary to beam hardening artifact from patient's earrings (patient refused removal). No mass lesion, hemorrhage, hydrocephalus, acute infarct, intra-axial, or extra-axial fluid collection. Vascular: No hyperdense vessel or unexpected calcification. Skull: Normal Sinuses/Orbits: Normal imaged portions of the orbits and globes. Near complete opacification of the left maxillary sinus, new. There is minimal mucosal thickening of left  ethmoid air cells. Clear mastoid air cells. Other: None. IMPRESSION: 1.  No acute intracranial abnormality. 2. Mild age advanced cerebral atrophy. 3. Sinus disease. Electronically Signed   By: Abigail Miyamoto M.D.   On: 10/29/2018 21:23   Mr Jeri Cos XN Contrast  Result Date: 11/01/2018 CLINICAL DATA:  Pituitary/endocrine dysfunction. Nausea and vomiting. EXAM: MRI HEAD WITHOUT AND WITH CONTRAST TECHNIQUE: Multiplanar, multiecho pulse sequences of the brain and surrounding structures were obtained without and with intravenous contrast. CONTRAST:  7.5 cc Gadavist COMPARISON:  Head CT 10/29/2018 and 10/06/2009. FINDINGS: Brain: Brain parenchyma itself does not show any acute or subacute infarction. Brainstem and cerebellum are normal. 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 There is mild chronic gliosis and volume loss in the deep insula and left parietal lobe consistent with an old ischemic insult in this region. There is no evidence of intra-axial mass lesion, hemorrhage, hydrocephalus or extra-axial collection. There is a cystic or necrotic lesion within the pituitary gland measuring a cm in size. Along the anterior margin, this has a bifid extension which measures right-to-left up to 13 mm. There does appear to be some enhancing pituitary tissue around the margins. 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 There is no T1 hyperintensity precontrast or apparent  susceptibility to suggest pituitary apoplexy/hemorrhage, but that is not completely excluded. Primary pituitary abscess is quite rare. Vascular: Major vessels at the base of the brain show flow. Skull and upper cervical spine: Negative Sinuses/Orbits: Acute left maxillary sinusitis.  Orbits negative. 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 See above discussion. The differential diagnosis does include atypical Rathke's cleft cyst, pituitary apoplexy and pituitary abscess, but those are not favored. Electronically Signed   By: Nelson Chimes M.D.   On: 11/01/2018 08:55   Dg Chest Portable 1 View  Result Date: 10/29/2018 CLINICAL DATA:  Nausea and vomiting EXAM: PORTABLE CHEST 1 VIEW COMPARISON:  None. FINDINGS: The heart size and mediastinal contours are within normal limits. Both lungs are clear. The visualized skeletal structures are unremarkable. IMPRESSION: No active disease. Electronically Signed   By: Ulyses Jarred M.D.   On: 10/29/2018 19:21    Microbiology: Recent Results (from the past 240 hour(s))  Urine culture     Status: None   Collection Time: 10/29/18  7:59 PM  Result Value Ref Range Status   Specimen Description   Final    URINE, RANDOM Performed at [facility] [address] [place], [place] [address]    Special Requests   Final    NONE Performed at [facility], [place] [address] [place], [place] [address]    Culture   Final    NO GROWTH Performed at [facility], [place] [address], [place], [place] [address]    Report Status 10/30/2018 FINAL  Final  MRSA PCR Screening     Status: None   Collection Time: 10/30/18  3:10 AM  Result Value Ref Range Status   MRSA by PCR NEGATIVE NEGATIVE Final    Comment:        The GeneXpert MRSA Assay (FDA approved for NASAL specimens only), is one component of a comprehensive MRSA colonization surveillance program. It is not intended to diagnose MRSA infection nor to guide or monitor treatment for MRSA infections. Performed at [facility], [place] [address], [place], [place] [address]      Labs: Basic Metabolic Panel: Recent Labs  Lab 10/29/18 1841  10/30/18 0415  10/31/18 4496  11/01/18 0528  11/01/18 1426 11/01/18 2001 11/02/18 0303 11/03/18 0521 11/04/18 0621  NA 111*   < > 111*   < > 119*   < > 127* 128* 129* 130* 132* 134*  K 3.5   < > 5.1  --  4.1  --  4.5  --   --  4.3 4.1 4.0  CL 78*   < > 82*  --  92*  --  97*  --   --  99 98 99  CO2 21*   < > 18*  --  19*  --  21*  --   --  23 25 26   GLUCOSE 75   < > 56*  --  78  --  65*  --   --  74 62* 70  BUN 6   < > <5*  --  <5*  --  <5*  --   --  6 8 10   CREATININE 0.90   < > 0.92  --  0.86  --  0.86  --   --  0.99 1.04* 1.13*  CALCIUM 8.4*   < > 7.6*  --  7.9*  --  8.5*  --   --  9.1 9.2 9.1  MG 1.5*  --  2.0  --   --   --   --   --   --   --  1.9  --   PHOS  --   --  2.7  --   --   --   --   --   --   --   --   --    < > = values in this interval not displayed.   Liver Function Tests: Recent Labs  Lab 10/29/18 1841  AST 40  ALT 24  ALKPHOS 54  BILITOT 0.5  PROT 6.9  ALBUMIN 3.7   Recent Labs  Lab 10/29/18 1841  LIPASE 38   No results for input(s): AMMONIA in the last 168 hours. CBC: Recent Labs  Lab 10/29/18 1841 10/31/18 0613  WBC 6.5 5.0  NEUTROABS 2.2  --   HGB 13.7 13.2  HCT 37.3 37.1  MCV 80.7 83.4  PLT 287 297   Cardiac Enzymes: Recent Labs  Lab 10/29/18 1857 10/30/18 0030 11/03/18 0521  CKTOTAL  --  290* 96  TROPONINI <0.03  --   --    BNP: BNP (last 3 results) No results for input(s): BNP in the last 8760 hours.  ProBNP (last 3 results) No results for input(s): PROBNP in the last 8760 hours.  CBG: Recent Labs  Lab 10/30/18 0606 10/30/18 0838  GLUCAP 78 87       Signed:  Colson Barco  [facility] 11/04/2018, 9:00 AM

## 2018-11-04 NOTE — TOC Transition Note (Signed)
Transition of Care [facility]) - CM/SW Discharge Note   Patient Details  Name: Colleen Smith MRN: [id number] Date of Birth: [birth year]  [facility] CM/SW Contact:  Leeroy Cha, RN Phone Number: 11/04/2018, 12:06 PM   Clinical Narrative:    Patient has refused hhc, floor rn is aware.   Final next level of care: [place] Barriers to Discharge: No Barriers Identified   Patient Goals and CMS Choice Patient states their goals for this hospitalization and ongoing recovery are:: just to go home CMS Medicare.gov Compare Post Acute Care list provided to:: Patient    Discharge Placement                       Discharge Plan and Services   Discharge Planning Services: CM Consult Post Acute Care Choice: Home Health              HH Arranged: PT, OT [facility]: [place] [facility])   Social Determinants of Health (SDOH) Interventions     Readmission Risk Interventions No flowsheet data found.

## 2018-11-04 NOTE — Progress Notes (Signed)
Occupational Therapy Treatment Patient Details Name: Colleen Smith MRN: [id number] DOB: [birth year] Today's Date: 11/04/2018    History of present illness 58 YO female admitted 10/29/18 with weakness, dehydration, hyponatremia. H/O depression    OT comments  Goals met .  Pt will DC home this day.            Precautions / Restrictions Precautions Precautions: Fall Restrictions Weight Bearing Restrictions: No       Mobility Bed Mobility Overal bed mobility: Independent                Transfers Overall transfer level: Independent                    Balance Overall balance assessment: Needs assistance Sitting-balance support: No upper extremity supported;Feet supported Sitting balance-Leahy Scale: Good     Standing balance support: During functional activity;Bilateral upper extremity supported Standing balance-Leahy Scale: Fair                             ADL either performed or assessed with clinical judgement   ADL Overall ADL's : Independent         Upper Body Bathing: Independent   Lower Body Bathing: Independent                                         Cognition Arousal/Alertness: Awake/alert Behavior During Therapy: WFL for tasks assessed/performed;Flat affect Overall Cognitive Status: Within Functional Limits for tasks assessed                                                     Pertinent Vitals/ Pain       Pain Assessment: No/denies pain      Progress Toward Goals  OT Goals(current goals can now be found in the care plan section)  Progress towards OT goals: Goals met/education completed, patient discharged from [place] All goals met and education completed, patient discharged from [place] OT "6 Clicks" Daily Activity     Outcome Measure   Help from another person eating meals?: None Help from another person taking care of personal grooming?: None Help from  another person toileting, which includes using toliet, bedpan, or urinal?: None Help from another person bathing (including washing, rinsing, drying)?: None Help from another person to put on and taking off regular upper body clothing?: None Help from another person to put on and taking off regular lower body clothing?: None 6 Click Score: 24    End of Session        Activity Tolerance Patient tolerated treatment well   Patient Left in bed   Nurse Communication Mobility status        Time: 1040-1058 OT Time Calculation (min): 18 min  Charges: OT General Charges $OT Visit: 1 Visit OT Treatments $Self Care/Home Management : 8-22 mins  Kari Baars, OT Acute Rehabilitation Services [phone] Office- [phone]      Colleen Smith, Colleen Smith 11/04/2018, 11:42 AM

## 2018-11-04 NOTE — Discharge Instructions (Signed)
Hypothyroidism  Hypothyroidism is when the thyroid gland does not make enough of certain hormones (it is underactive). The thyroid gland is a small gland located in the lower front part of the neck, just in front of the windpipe (trachea). This gland makes hormones that help control how the body uses food for energy (metabolism) as well as how the heart and brain function. These hormones also play a role in keeping your bones strong. When the thyroid is underactive, it produces too little of the hormones thyroxine (T4) and triiodothyronine (T3). What are the causes? This condition may be caused by:  Hashimoto's disease. This is a disease in which the body's disease-fighting system (immune system) attacks the thyroid gland. This is the most common cause.  Viral infections.  Pregnancy.  Certain medicines.  Birth defects.  Past radiation treatments to the head or neck for cancer.  Past treatment with radioactive iodine.  Past exposure to radiation in the environment.  Past surgical removal of part or all of the thyroid.  Problems with a gland in the center of the brain (pituitary gland).  Lack of enough iodine in the diet. What increases the risk? You are more likely to develop this condition if:  You are female.  You have a family history of thyroid conditions.  You use a medicine called lithium.  You take medicines that affect the immune system (immunosuppressants). What are the signs or symptoms? Symptoms of this condition include:  Feeling as though you have no energy (lethargy).  Not being able to tolerate cold.  Weight gain that is not explained by a change in diet or exercise habits.  Lack of appetite.  Dry skin.  Coarse hair.  Menstrual irregularity.  Slowing of thought processes.  Constipation.  Sadness or depression. How is this diagnosed? This condition may be diagnosed based on:  Your symptoms, your medical history, and a physical exam.  Blood  tests. You may also have imaging tests, such as an ultrasound or MRI. How is this treated? This condition is treated with medicine that replaces the thyroid hormones that your body does not make. After you begin treatment, it may take several weeks for symptoms to go away. Follow these instructions at home:  Take over-the-counter and prescription medicines only as told by your health care provider.  If you start taking any new medicines, tell your health care provider.  Keep all follow-up visits as told by your health care provider. This is important. ? As your condition improves, your dosage of thyroid hormone medicine may change. ? You will need to have blood tests regularly so that your health care provider can monitor your condition. Contact a health care provider if:  Your symptoms do not get better with treatment.  You are taking thyroid replacement medicine and you: ? Sweat a lot. ? Have tremors. ? Feel anxious. ? Lose weight rapidly. ? Cannot tolerate heat. ? Have emotional swings. ? Have diarrhea. ? Feel weak. Get help right away if you have:  Chest pain.  An irregular heartbeat.  A rapid heartbeat.  Difficulty breathing. Summary  Hypothyroidism is when the thyroid gland does not make enough of certain hormones (it is underactive).  When the thyroid is underactive, it produces too little of the hormones thyroxine (T4) and triiodothyronine (T3).  The most common cause is Hashimoto's disease, a disease in which the body's disease-fighting system (immune system) attacks the thyroid gland. The condition can also be caused by viral infections, medicine, pregnancy, or past  radiation treatment to the head or neck.  Symptoms may include weight gain, dry skin, constipation, feeling as though you do not have energy, and not being able to tolerate cold.  This condition is treated with medicine to replace the thyroid hormones that your body does not make. This information  is not intended to replace advice given to you by your health care provider. Make sure you discuss any questions you have with your health care provider. Document Released: 07/03/2005 Document Revised: 06/13/2017 Document Reviewed: 06/13/2017 Elsevier Interactive Patient Education  2019 [employer].  Levothyroxine tablets What is this medicine? LEVOTHYROXINE (lee voe thye ROX een) is a thyroid hormone. This medicine can improve symptoms of thyroid deficiency such as slow speech, lack of energy, weight gain, hair loss, dry skin, and feeling cold. It also helps to treat goiter (an enlarged thyroid gland). It is also used to treat some kinds of thyroid cancer along with surgery and other medicines. This medicine may be used for other purposes; ask your health care provider or pharmacist if you have questions. COMMON BRAND NAME(S): Estre, Euthyrox, Levo-T, Levothroid, Levoxyl, Synthroid, Thyro-Tabs, Unithroid What should I tell my health care provider before I take this medicine? They need to know if you have any of these conditions: -Addison's disease or other adrenal gland problem -angina -bone problems -diabetes -dieting or on a weight loss program -fertility problems -heart disease -pituitary gland problem -take medicines that treat or prevent blood clots -an unusual or allergic reaction to levothyroxine, thyroid hormones, other medicines, foods, dyes, or preservatives -pregnant or trying to get pregnant -breast-feeding How should I use this medicine? Take this medicine by mouth with plenty of water. It is best to take on an empty stomach, at least 30 minutes before or 2 hours after food. Follow the directions on the prescription label. Take at the same time each day. Do not take your medicine more often than directed. Contact your pediatrician regarding the use of this medicine in children. While this drug may be prescribed for children and infants as young as a few days of age for  selected conditions, precautions do apply. For infants, you may crush the tablet and place in a small amount of (5-10 ml or 1 to 2 teaspoonfuls) of water, breast milk, or non-soy based infant formula. Do not mix with soy-based infant formula. Give as directed. Overdosage: If you think you have taken too much of this medicine contact a poison control center or emergency room at once. NOTE: This medicine is only for you. Do not share this medicine with others. What if I miss a dose? If you miss a dose, take it as soon as you can. If it is almost time for your next dose, take only that dose. Do not take double or extra doses. What may interact with this medicine? -amiodarone -antacids -anti-thyroid medicines -calcium supplements -carbamazepine -certain medicines for depression -certain medicines to treat cancer -cholestyramine -clofibrate -colesevelam -colestipol -digoxin -female hormones, like estrogens or progestins and birth control pills, patches, rings, or injections -iron supplements -kayexylate -ketamine -liquid nutrition products like Ensure -lithium -medicines for colds and breathing difficulties -medicines for diabetes -medicines or dietary supplements for weight loss -methadone -niacin -orlistat -oxandrolone -phenobarbital or other barbiturates -phenytoin -rifampin -sevelamer -simethicone -soy isoflavones -steroid medicines like prednisone or cortisone -sucralfate -testosterone -theophylline -warfarin This list may not describe all possible interactions. Give your health care provider a list of all the medicines, herbs, non-prescription drugs, or dietary supplements you use. Also tell  them if you smoke, drink alcohol, or use illegal drugs. Some items may interact with your medicine. What should I watch for while using this medicine? Be sure to take this medicine with plenty of fluids. Some tablets may cause choking, gagging, or difficulty swallowing from the tablet  getting stuck in your throat. Most of these problems disappear if the medicine is taken with the right amount of water or other fluids. Do not switch brands of this medicine unless your health care professional agrees with the change. Ask questions if you are uncertain. You will need regular exams and occasional blood tests to check the response to treatment. If you are receiving this medicine for an underactive thyroid, it may be several weeks before you notice an improvement. Check with your doctor or health care professional if your symptoms do not improve. It may be necessary for you to take this medicine for the rest of your life. Do not stop using this medicine unless your doctor or health care professional advises you to. This medicine can affect blood sugar levels. If you have diabetes, check your blood sugar as directed. You may lose some of your hair when you first start treatment. With time, this usually corrects itself. If you are going to have surgery, tell your doctor or health care professional that you are taking this medicine. What side effects may I notice from receiving this medicine? Side effects that you should report to your doctor or health care professional as soon as possible: -allergic reactions like skin rash, itching or hives, swelling of the face, lips, or tongue -anxious -breathing problems -changes in menstrual periods -chest pain -diarrhea -excessive sweating or intolerance to heat -fast or irregular heartbeat -leg cramps -nervousness -swelling of ankles, feet, or legs -tremors -trouble sleeping -vomiting Side effects that usually do not require medical attention (report to your doctor or health care professional if they continue or are bothersome): -changes in appetite -headache -irritable -nausea -weight loss This list may not describe all possible side effects. Call your doctor for medical advice about side effects. You may report side effects to FDA at  1-800-FDA-1088. Where should I keep my medicine? Keep out of the reach of children. Store at room temperature between 15 and 30 degrees C (59 and 86 degrees F). Protect from light and moisture. Keep container tightly closed. Throw away any unused medicine after the expiration date. NOTE: This sheet is a summary. It may not cover all possible information. If you have questions about this medicine, talk to your doctor, pharmacist, or health care provider.  2019 Elsevier/Gold Standard (2016-08-14 15:39:30)  Hydrocortisone tablets What is this medicine? HYDROCORTISONE (hye droe KOR ti sone) is a corticosteroid. It is commonly used to treat inflammation of the skin, joints, lungs, and other organs. Common conditions treated include asthma, allergies, and arthritis. It is also used for other conditions, like blood disorders and diseases of the adrenal glands. This medicine may be used for other purposes; ask your health care provider or pharmacist if you have questions. COMMON BRAND NAME(S): Cortef, Hydrocortone What should I tell my health care provider before I take this medicine? They need to know if you have any of these conditions: -Cushing's syndrome -diabetes -glaucoma -heart problems or disease -high blood pressure -infection like herpes, measles, tuberculosis, or chickenpox -kidney disease -liver disease -mental problems -myasthenia gravis -osteoporosis -previous heart attack -seizures -stomach, ulcer or intestine disease including colitis and diverticulitis -thyroid problem -an unusual or allergic reaction to hydrocortisone, corticosteroids, other  medicines, lactose, foods, dyes, or preservatives -pregnant or trying to get pregnant -breast-feeding How should I use this medicine? Take this medicine by mouth with a drink of water. Follow the directions on the prescription label. Take it with food or milk to avoid stomach upset. If you are taking this medicine once a day, take it  in the morning. Do not take more medicine than you are told to take. Do not suddenly stop taking your medicine because you may develop a severe reaction. Your doctor will tell you how much medicine to take. If your doctor wants you to stop the medicine, the dose may be slowly lowered over time to avoid any side effects. Talk to your pediatrician regarding the use of this medicine in children. Special care may be needed. Overdosage: If you think you have taken too much of this medicine contact a poison control center or emergency room at once. NOTE: This medicine is only for you. Do not share this medicine with others. What if I miss a dose? If you miss a dose, take it as soon as you can. If it is almost time for your next dose, take only that dose. Do not take double or extra doses. What may interact with this medicine? Do not take this medicine with any of the following medications: -mifepristone, RU-486 -vaccines This medicine may also interact with the following medications: -antibiotics like clarithromycin, erythromycin, and troleandomycin -aspirin and aspirin-like drugs -barbiturates like phenobarbital -ketoconazole -phenytoin -rifampin -warfarin This list may not describe all possible interactions. Give your health care provider a list of all the medicines, herbs, non-prescription drugs, or dietary supplements you use. Also tell them if you smoke, drink alcohol, or use illegal drugs. Some items may interact with your medicine. What should I watch for while using this medicine? Visit your doctor or health care professional for regular checks on your progress. If you are taking this medicine over a prolonged period, carry an identification card with your name and address, the type and dose of your medicine, and your doctor's name and address. This medicine may increase your risk of getting an infection. Stay away from people who are sick. Tell your doctor or health care professional if you  are around anyone with measles or chickenpox. If you are going to have surgery, tell your doctor or health care professional that you have taken this medicine within the last twelve months. Ask your doctor or health care professional about your diet. You may need to lower the amount of salt you eat. The medicine can increase your blood sugar. If you are a diabetic check with your doctor if you need help adjusting the dose of your diabetic medicine. What side effects may I notice from receiving this medicine? Side effects that you should report to your doctor or health care professional as soon as possible: -changes in vision -fever, sore throat, sneezing, cough, or other signs of infection, wounds that will not heal -frequent passing of urine -increased thirst -mental depression, mood swings, mistaken feelings of self importance or of being mistreated -pain in hips, back, ribs, arms, shoulders, or legs -severe stomach pain -swelling of feet or lower legs -unusually weak or tired -vomiting Side effects that usually do not require medical attention (report to your doctor or health care professional if they continue or are bothersome): -headache -increased appetite -nausea -skin problems, acne, thin and shiny skin This list may not describe all possible side effects. Call your doctor for medical advice about side  effects. You may report side effects to FDA at 1-800-FDA-1088. Where should I keep my medicine? Keep out of the reach of children. Store at room temperature between 20 and 25 degrees C (68 and 77 degrees F). Throw away any unused medicine after the expiration date. NOTE: This sheet is a summary. It may not cover all possible information. If you have questions about this medicine, talk to your doctor, pharmacist, or health care provider.  2019 Elsevier/Gold Standard (2007-11-15 15:56:15)  Loratadine capsules or tablets What is this medicine? LORATADINE (lor AT a deen) is an  antihistamine. It helps to relieve sneezing, runny nose, and itchy, watery eyes. This medicine is used to treat the symptoms of allergies. It is also used to treat itchy skin rash and hives. This medicine may be used for other purposes; ask your health care provider or pharmacist if you have questions. COMMON BRAND NAME(S): Alavert, Allergy Relief, Claritin, Claritin Hives Relief, Claritin Liqui-Gel, Claritin-D 24 Hour, Clear-Atadine, QlearQuil All Day & All Night Allergy Relief, Tavist ND What should I tell my health care provider before I take this medicine? They need to know if you have any of these conditions: -asthma -kidney disease -liver disease -an unusual or allergic reaction to loratadine, other antihistamines, other medicines, foods, dyes, or preservatives -pregnant or trying to get pregnant -breast-feeding How should I use this medicine? Take this medicine by mouth with a glass of water. Follow the directions on the label. You may take this medicine with food or on an empty stomach. Take your medicine at regular intervals. Do not take your medicine more often than directed. Talk to your pediatrician regarding the use of this medicine in children. While this medicine may be used in children as young as 6 years for selected conditions, precautions do apply. Overdosage: If you think you have taken too much of this medicine contact a poison control center or emergency room at once. NOTE: This medicine is only for you. Do not share this medicine with others. What if I miss a dose? If you miss a dose, take it as soon as you can. If it is almost time for your next dose, take only that dose. Do not take double or extra doses. What may interact with this medicine? -other medicines for colds or allergies This list may not describe all possible interactions. Give your health care provider a list of all the medicines, herbs, non-prescription drugs, or dietary supplements you use. Also tell them  if you smoke, drink alcohol, or use illegal drugs. Some items may interact with your medicine. What should I watch for while using this medicine? Tell your doctor or healthcare professional if your symptoms do not start to get better or if they get worse. Your mouth may get dry. Chewing sugarless gum or sucking hard candy, and drinking plenty of water may help. Contact your doctor if the problem does not go away or is severe. You may get drowsy or dizzy. Do not drive, use machinery, or do anything that needs mental alertness until you know how this medicine affects you. Do not stand or sit up quickly, especially if you are an older patient. This reduces the risk of dizzy or fainting spells. What side effects may I notice from receiving this medicine? Side effects that you should report to your doctor or health care professional as soon as possible: -allergic reactions like skin rash, itching or hives, swelling of the face, lips, or tongue -breathing problems -unusually restless or nervous Side  effects that usually do not require medical attention (report to your doctor or health care professional if they continue or are bothersome): -drowsiness -dry or irritated mouth or throat -headache This list may not describe all possible side effects. Call your doctor for medical advice about side effects. You may report side effects to FDA at 1-800-FDA-1088. Where should I keep my medicine? Keep out of the reach of children. Store at room temperature between 2 and 30 degrees C (36 and 86 degrees F). Protect from moisture. Throw away any unused medicine after the expiration date. NOTE: This sheet is a summary. It may not cover all possible information. If you have questions about this medicine, talk to your doctor, pharmacist, or health care provider.  2019 Elsevier/Gold Standard (2008-01-06 17:17:24)  Tramadol tablets What is this medicine? TRAMADOL (TRA ma dole) is a pain reliever. It is used to treat  moderate to severe pain in adults. This medicine may be used for other purposes; ask your health care provider or pharmacist if you have questions. COMMON BRAND NAME(S): Ultram What should I tell my health care provider before I take this medicine? They need to know if you have any of these conditions: -brain tumor -depression -drug abuse or addiction -head injury -if you frequently drink alcohol containing drinks -kidney disease or trouble passing urine -liver disease -lung disease, asthma, or breathing problems -seizures or epilepsy -suicidal thoughts, plans, or attempt; a previous suicide attempt by you or a family member -an unusual or allergic reaction to tramadol, codeine, other medicines, foods, dyes, or preservatives -pregnant or trying to get pregnant -breast-feeding How should I use this medicine? Take this medicine by mouth with a full glass of water. Follow the directions on the prescription label. You can take it with or without food. If it upsets your stomach, take it with food. Do not take your medicine more often than directed. A special MedGuide will be given to you by the pharmacist with each prescription and refill. Be sure to read this information carefully each time. Talk to your pediatrician regarding the use of this medicine in children. Special care may be needed. Overdosage: If you think you have taken too much of this medicine contact a poison control center or emergency room at once. NOTE: This medicine is only for you. Do not share this medicine with others. What if I miss a dose? If you miss a dose, take it as soon as you can. If it is almost time for your next dose, take only that dose. Do not take double or extra doses. What may interact with this medicine? Do not take this medication with any of the following medicines: -MAOIs like Carbex, Eldepryl, Marplan, Nardil, and Parnate This medicine may also interact with the following  medications: -alcohol -antihistamines for allergy, cough and cold -certain medicines for anxiety or sleep -certain medicines for depression like amitriptyline, fluoxetine, sertraline -certain medicines for migraine headache like almotriptan, eletriptan, frovatriptan, naratriptan, rizatriptan, sumatriptan, zolmitriptan -certain medicines for seizures like carbamazepine, oxcarbazepine, phenobarbital, primidone -certain medicines that treat or prevent blood clots like warfarin -digoxin -furazolidone -general anesthetics like halothane, isoflurane, methoxyflurane, propofol -linezolid -local anesthetics like lidocaine, pramoxine, tetracaine -medicines that relax muscles for surgery -other narcotic medicines for pain or cough -phenothiazines like chlorpromazine, mesoridazine, prochlorperazine, thioridazine -procarbazine This list may not describe all possible interactions. Give your health care provider a list of all the medicines, herbs, non-prescription drugs, or dietary supplements you use. Also tell them if you smoke, drink alcohol,  or use illegal drugs. Some items may interact with your medicine. What should I watch for while using this medicine? Tell your doctor or health care professional if your pain does not go away, if it gets worse, or if you have new or a different type of pain. You may develop tolerance to the medicine. Tolerance means that you will need a higher dose of the medicine for pain relief. Tolerance is normal and is expected if you take this medicine for a long time. Do not suddenly stop taking your medicine because you may develop a severe reaction. Your body becomes used to the medicine. This does NOT mean you are addicted. Addiction is a behavior related to getting and using a drug for a non-medical reason. If you have pain, you have a medical reason to take pain medicine. Your doctor will tell you how much medicine to take. If your doctor wants you to stop the medicine, the  dose will be slowly lowered over time to avoid any side effects. There are different types of narcotic medicines (opiates). If you take more than one type at the same time or if you are taking another medicine that also causes drowsiness, you may have more side effects. Give your health care provider a list of all medicines you use. Your doctor will tell you how much medicine to take. Do not take more medicine than directed. Call emergency for help if you have problems breathing or unusual sleepiness. You may get drowsy or dizzy. Do not drive, use machinery, or do anything that needs mental alertness until you know how this medicine affects you. Do not stand or sit up quickly, especially if you are an older patient. This reduces the risk of dizzy or fainting spells. Alcohol can increase or decrease the effects of this medicine. Avoid alcoholic drinks. You may have constipation. Try to have a bowel movement at least every 2 to 3 days. If you do not have a bowel movement for 3 days, call your doctor or health care professional. Your mouth may get dry. Chewing sugarless gum or sucking hard candy, and drinking plenty of water may help. Contact your doctor if the problem does not go away or is severe. What side effects may I notice from receiving this medicine? Side effects that you should report to your doctor or health care professional as soon as possible: -allergic reactions like skin rash, itching or hives, swelling of the face, lips, or tongue -breathing problems -confusion -seizures -signs and symptoms of low blood pressure like dizziness; feeling faint or lightheaded, falls; unusually weak or tired -trouble passing urine or change in the amount of urine Side effects that usually do not require medical attention (report to your doctor or health care professional if they continue or are bothersome): -constipation -dry mouth -nausea, vomiting -tiredness This list may not describe all possible side  effects. Call your doctor for medical advice about side effects. You may report side effects to FDA at 1-800-FDA-1088. Where should I keep my medicine? Keep out of the reach of children. This medicine may cause accidental overdose and death if it taken by other adults, children, or pets. Mix any unused medicine with a substance like cat litter or coffee grounds. Then throw the medicine away in a sealed container like a sealed bag or a coffee can with a lid. Do not use the medicine after the expiration date. Store at room temperature between 15 and 30 degrees C (59 and 86 degrees F). NOTE:  This sheet is a summary. It may not cover all possible information. If you have questions about this medicine, talk to your doctor, pharmacist, or health care provider.  2019 [employer]/Gold Standard (2015-03-28 09:00:04)

## 2018-11-05 LAB — ALDOSTERONE + RENIN ACTIVITY W/ RATIO
ALDO / PRA Ratio: >32.3 — ABNORMAL HIGH (ref 0.0–30.0)
Aldosterone: 5.4 ng/dL (ref 0.0–30.0)
PRA LC/MS/MS: <0.167 ng/mL/hr — ABNORMAL LOW (ref 0.167–5.380)

## 2018-11-05 LAB — INSULIN-LIKE GROWTH FACTOR: Somatomedin C: 15 ng/mL — ABNORMAL LOW (ref 46–172)

## 2019-08-18 ENCOUNTER — Other Ambulatory Visit: Payer: Self-pay | Admitting: Neurosurgery

## 2019-08-18 DIAGNOSIS — E236 Other disorders of pituitary gland: Secondary | ICD-10-CM

## 2019-09-17 ENCOUNTER — Other Ambulatory Visit: Payer: 59

## 2019-10-04 ENCOUNTER — Ambulatory Visit
Admission: RE | Admit: 2019-10-04 | Discharge: 2019-10-04 | Disposition: A | Discharge status: Home / Self Care | Payer: 59 | Source: Ambulatory Visit | Attending: Neurosurgery | Admitting: Neurosurgery

## 2019-10-04 ENCOUNTER — Other Ambulatory Visit: Payer: Self-pay

## 2019-10-04 DIAGNOSIS — E236 Other disorders of pituitary gland: Secondary | ICD-10-CM

## 2019-10-04 MED ORDER — GADOBENATE DIMEGLUMINE 529 MG/ML IV SOLN
8.0000 mL | Freq: Once | INTRAVENOUS | Status: AC | PRN
  Administered 2019-10-04: 8 mL via INTRAVENOUS

## 2020-03-25 ENCOUNTER — Emergency Department (HOSPITAL_COMMUNITY): Payer: 59

## 2020-03-25 ENCOUNTER — Other Ambulatory Visit: Payer: Self-pay

## 2020-03-25 ENCOUNTER — Encounter (HOSPITAL_COMMUNITY): Payer: Self-pay | Admitting: Emergency Medicine

## 2020-03-25 ENCOUNTER — Emergency Department (HOSPITAL_COMMUNITY)
Admission: EM | Admit: 2020-03-25 | Discharge: 2020-03-25 | Disposition: A | Discharge status: Home / Self Care | Payer: 59 | Attending: Emergency Medicine | Admitting: Emergency Medicine

## 2020-03-25 DIAGNOSIS — Z79899 Other long term (current) drug therapy: Secondary | ICD-10-CM | POA: Insufficient documentation

## 2020-03-25 DIAGNOSIS — B349 Viral infection, unspecified: Secondary | ICD-10-CM | POA: Insufficient documentation

## 2020-03-25 DIAGNOSIS — Z87891 Personal history of nicotine dependence: Secondary | ICD-10-CM | POA: Diagnosis not present

## 2020-03-25 DIAGNOSIS — Z20822 Contact with and (suspected) exposure to covid-19: Secondary | ICD-10-CM | POA: Insufficient documentation

## 2020-03-25 DIAGNOSIS — R531 Weakness: Secondary | ICD-10-CM | POA: Diagnosis present

## 2020-03-25 LAB — COMPREHENSIVE METABOLIC PANEL
ALT: 17 U/L (ref 0–44)
AST: 19 U/L (ref 15–41)
Albumin: 4.4 g/dL (ref 3.5–5.0)
Alkaline Phosphatase: 48 U/L (ref 38–126)
Anion gap: 15 (ref 5–15)
BUN: 14 mg/dL (ref 6–20)
CO2: 20 mmol/L — ABNORMAL LOW (ref 22–32)
Calcium: 9.8 mg/dL (ref 8.9–10.3)
Chloride: 102 mmol/L (ref 98–111)
Creatinine, Ser: 1.08 mg/dL — ABNORMAL HIGH (ref 0.44–1.00)
GFR calc Af Amer: >60 mL/min (ref >60)
GFR calc non Af Amer: 56 mL/min — ABNORMAL LOW (ref >60)
Glucose, Bld: 82 mg/dL (ref 70–99)
Potassium: 4.3 mmol/L (ref 3.5–5.1)
Sodium: 137 mmol/L (ref 135–145)
Total Bilirubin: 0.6 mg/dL (ref 0.3–1.2)
Total Protein: 8.4 g/dL — ABNORMAL HIGH (ref 6.5–8.1)

## 2020-03-25 LAB — CBC WITH DIFFERENTIAL/PLATELET
Abs Immature Granulocytes: 0.01 10*3/uL (ref 0.00–0.07)
Basophils Absolute: 0.1 10*3/uL (ref 0.0–0.1)
Basophils Relative: 1 %
Eosinophils Absolute: 0.1 10*3/uL (ref 0.0–0.5)
Eosinophils Relative: 1 %
HCT: 47.5 % — ABNORMAL HIGH (ref 36.0–46.0)
Hemoglobin: 15.6 g/dL — ABNORMAL HIGH (ref 12.0–15.0)
Immature Granulocytes: 0 %
Lymphocytes Relative: 28 %
Lymphs Abs: 2.3 10*3/uL (ref 0.7–4.0)
MCH: 29.7 pg (ref 26.0–34.0)
MCHC: 32.8 g/dL (ref 30.0–36.0)
MCV: 90.5 fL (ref 80.0–100.0)
Monocytes Absolute: 0.4 10*3/uL (ref 0.1–1.0)
Monocytes Relative: 4 %
Neutro Abs: 5.4 10*3/uL (ref 1.7–7.7)
Neutrophils Relative %: 66 %
Platelets: 368 10*3/uL (ref 150–400)
RBC: 5.25 MIL/uL — ABNORMAL HIGH (ref 3.87–5.11)
RDW: 13.1 % (ref 11.5–15.5)
WBC: 8.3 10*3/uL (ref 4.0–10.5)
nRBC: 0 % (ref 0.0–0.2)

## 2020-03-25 LAB — RESP PANEL BY RT PCR (RSV, FLU A&B, COVID)
Influenza A by PCR: NEGATIVE
Influenza B by PCR: NEGATIVE
Respiratory Syncytial Virus by PCR: NEGATIVE
SARS Coronavirus 2 by RT PCR: NEGATIVE

## 2020-03-25 LAB — URINALYSIS, ROUTINE W REFLEX MICROSCOPIC
Bilirubin Urine: NEGATIVE
Glucose, UA: NEGATIVE mg/dL
Hgb urine dipstick: NEGATIVE
Ketones, ur: 20 mg/dL — AB
Leukocytes,Ua: NEGATIVE
Nitrite: NEGATIVE
Protein, ur: NEGATIVE mg/dL
Specific Gravity, Urine: 1.009 (ref 1.005–1.030)
pH: 5 (ref 5.0–8.0)

## 2020-03-25 LAB — LIPASE, BLOOD: Lipase: 32 U/L (ref 11–51)

## 2020-03-25 MED ORDER — SODIUM CHLORIDE 0.9 % IV SOLN: INTRAVENOUS | Status: DC

## 2020-03-25 MED ORDER — KETOROLAC TROMETHAMINE 30 MG/ML IJ SOLN
15.0000 mg | Freq: Once | INTRAMUSCULAR | Status: AC
  Administered 2020-03-25: 15 mg via INTRAVENOUS
  Filled 2020-03-25: qty 1

## 2020-03-25 MED ORDER — SODIUM CHLORIDE 0.9 % IV BOLUS
1000.0000 mL | Freq: Once | INTRAVENOUS | Status: AC
  Administered 2020-03-25: 1000 mL via INTRAVENOUS

## 2020-03-25 NOTE — ED Triage Notes (Signed)
C/O fever, body aches, loss of appetite, N/V for the past 5 days.

## 2020-03-25 NOTE — Discharge Instructions (Addendum)
Use Tylenol or Motrin as needed for your symptoms. Follow-up with your doctor as needed

## 2020-03-25 NOTE — ED Provider Notes (Signed)
[facility] DEPT Provider Note   CSN: [id number] Arrival date & time: 03/25/20  0930     History Chief Complaint  Patient presents with  . Weakness  . Vomiting    Colleen Smith is a 59 y.o. female.  59 year old female presents with several days of weakness, myalgias, emesis.  No abdominal or chest comfort.  Denies any diarrhea.  Patient states that she has been fully vaccinated against Covid.  Does note some increased urination but denies any dysuria.  No flank discomfort.  Symptoms unresponsive to home medications.        Past Medical History:  Diagnosis Date  . Depression     Patient Active Problem List   Diagnosis Date Noted  . Viral gastroenteritis 10/30/2018  . Hypomagnesemia 10/30/2018  . Asymptomatic bacteriuria 10/30/2018  . Myalgia 10/30/2018  . Hyponatremia 10/29/2018  . Routine gynecological examination 01/20/2013    Past Surgical History:  Procedure Laterality Date  . APPENDECTOMY       OB History   No obstetric history on file.     History reviewed. No pertinent family history.  Social History   Tobacco Use  . Smoking status: Former Smoker    Packs/day: 0.50    Years: 15.00    Pack years: 7.50    Quit date: 11/2015    Years since quitting: 4.3  . Smokeless tobacco: Never Used  Substance Use Topics  . Alcohol use: No    Alcohol/week: 0.0 standard drinks  . Drug use: No    Home Medications Prior to Admission medications   Medication Sig Start Date End Date Taking? Authorizing Provider  ALPRAZolam (XANAX) 0.25 MG tablet Take 0.25 mg by mouth 3 (three) times daily as needed for anxiety.  07/15/18   [provider]  buPROPion (WELLBUTRIN XL) 300 MG 24 hr tablet Take 300 mg by mouth daily.    [provider]  hydrocortisone (CORTEF) 5 MG tablet Take 3 tablets in the morning. Then take 1 tablet around 2pm. 11/04/18   Cristal Ford, DO  levothyroxine (SYNTHROID) 25 MCG tablet Take 1 tablet  (25 mcg total) by mouth daily at 6 (six) AM. 11/05/18   Mikhail, Velta Addison, DO  loratadine (CLARITIN) 10 MG tablet Take 1 tablet (10 mg total) by mouth daily. 11/05/18   Mikhail, Velta Addison, DO  sertraline (ZOLOFT) 50 MG tablet Take 50 mg by mouth daily. 10/23/18   [provider]  traMADol (ULTRAM) 50 MG tablet Take 1 tablet (50 mg total) by mouth every 6 (six) hours as needed for moderate pain. 11/04/18   Cristal Ford, DO    Allergies    Vicodin [hydrocodone-acetaminophen] and Penicillins  Review of Systems   Review of Systems  All other systems reviewed and are negative.   Physical Exam Updated Vital Signs BP (!) 157/104   Pulse 81   Temp 97.7 F (36.5 C) (Oral)   Resp 18   Ht 1.727 m (5\' 8" )   Wt 86.2 kg   LMP 10/09/2014 (Exact Date)   SpO2 98%   BMI 28.89 kg/m   Physical Exam Vitals and nursing note reviewed.  Constitutional:      General: She is not in acute distress.    Appearance: Normal appearance. She is well-developed. She is not toxic-appearing.  HENT:     Head: Normocephalic and atraumatic.  Eyes:     General: Lids are normal.     Conjunctiva/sclera: Conjunctivae normal.     Pupils: Pupils are equal, round, and  reactive to light.  Neck:     Thyroid: No thyroid mass.     Trachea: No tracheal deviation.  Cardiovascular:     Rate and Rhythm: Normal rate and regular rhythm.     Heart sounds: Normal heart sounds. No murmur heard.  No gallop.   Pulmonary:     Effort: Pulmonary effort is normal. No respiratory distress.     Breath sounds: Normal breath sounds. No stridor. No decreased breath sounds, wheezing, rhonchi or rales.  Abdominal:     General: Bowel sounds are normal. There is no distension.     Palpations: Abdomen is soft.     Tenderness: There is no abdominal tenderness. There is no rebound.  Musculoskeletal:        General: No tenderness. Normal range of motion.     Cervical back: Normal range of motion and neck supple.  Skin:    General:  Skin is warm and dry.     Findings: No abrasion or rash.  Neurological:     Mental Status: She is alert and oriented to person, place, and time.     GCS: GCS eye subscore is 4. GCS verbal subscore is 5. GCS motor subscore is 6.     Cranial Nerves: No cranial nerve deficit.     Sensory: No sensory deficit.  Psychiatric:        Speech: Speech normal.        Behavior: Behavior normal.     ED Results / Procedures / Treatments   Labs (all labs ordered are listed, but only abnormal results are displayed) Labs Reviewed  CBC WITH DIFFERENTIAL/PLATELET - Abnormal; Notable for the following components:      Result Value   RBC 5.25 (*)    Hemoglobin 15.6 (*)    HCT 47.5 (*)    All other components within normal limits  COMPREHENSIVE METABOLIC PANEL - Abnormal; Notable for the following components:   CO2 20 (*)    Creatinine, Ser 1.08 (*)    Total Protein 8.4 (*)    GFR calc non Af Amer 56 (*)    All other components within normal limits  URINALYSIS, ROUTINE W REFLEX MICROSCOPIC - Abnormal; Notable for the following components:   Ketones, ur 20 (*)    All other components within normal limits  RESP PANEL BY RT PCR (RSV, FLU A&B, COVID)  LIPASE, BLOOD    EKG None  Radiology No results found.  Procedures Procedures (including critical care time)  Medications Ordered in ED Medications  sodium chloride 0.9 % bolus 1,000 mL (has no administration in time range)  0.9 %  sodium chloride infusion (has no administration in time range)    ED Course  I have reviewed the triage vital signs and the nursing notes.  Pertinent labs & imaging results that were available during my care of the patient were reviewed by me and considered in my medical decision making (see chart for details).    MDM Rules/Calculators/A&P                          Patient given IV fluids here and feels better. Covid test as well as flu test negative. Able to take down fluids appropriately. Here suspect viral  illness and will discharge home Final Clinical Impression(s) / ED Diagnoses Final diagnoses:  None    Rx / DC Orders ED Discharge Orders    None       Lacretia Leigh, MD  03/25/20 1745  

## 2020-03-25 NOTE — ED Notes (Signed)
Attempted iv x 2

## 2022-02-05 IMAGING — MR MR HEAD WO/W CM
15 of 19 series · 34 of 48 positions shown · IV contrast (multihance)
Comparison: 11/01/2018

CLINICAL DATA: Pituitary AFib plexi

EXAM:
MRI HEAD WITHOUT AND WITH CONTRAST
TECHNIQUE: Multiplanar, multiecho pulse sequences of the brain and surrounding
structures were obtained without and with intravenous contrast.
CONTRAST:  8mL MULTIHANCE GADOBENATE DIMEGLUMINE 529 MG/ML IV SOLN

[Series 2: T1 · sagittal · 5.0mm · 0.45mm/px · 2 of 22 slices shown]
[im 1/22]
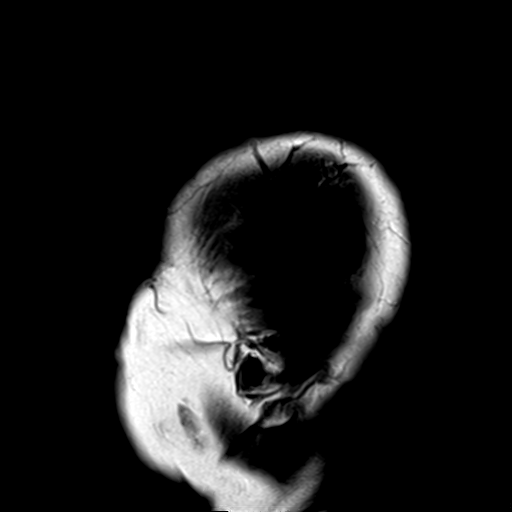
[im 22/22]
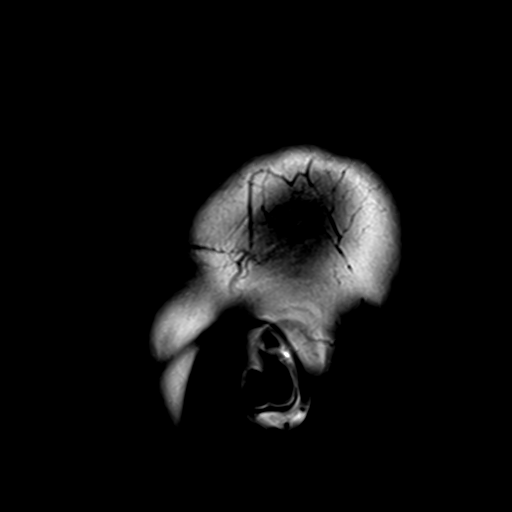

[Series 3: DWI · axial · 3.0mm · 1.80mm/px · z∈[-65,+90]mm · 8 of 106 slices shown]
[im 1/106]
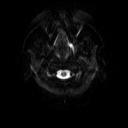
[im 12/106]
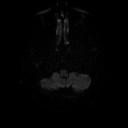
[im 36/106]
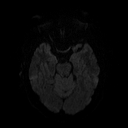
[im 47/106]
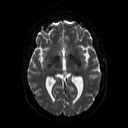
[im 59/106]
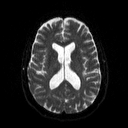
[im 71/106]
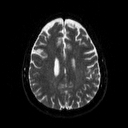
[im 94/106]
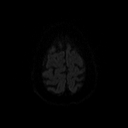
[im 106/106]
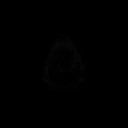

[Series 4: dwi_adc · axial · 3.0mm · 1.80mm/px · z∈[-65,+90]mm · 5 of 52 slices shown]
[im 1/52]
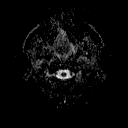
[im 13/52]
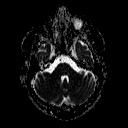
[im 26/52]
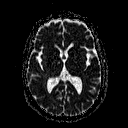
[im 39/52]
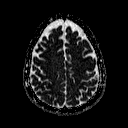
[im 52/52]
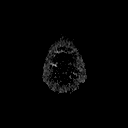

[Series 5: T2 · axial · 5.0mm · 0.36mm/px · z∈[-72,+91]mm · 3 of 26 slices shown]
[im 1/26]
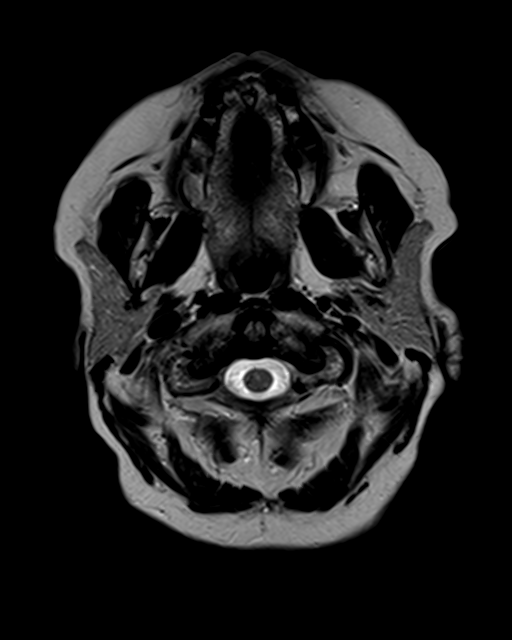
[im 13/26]
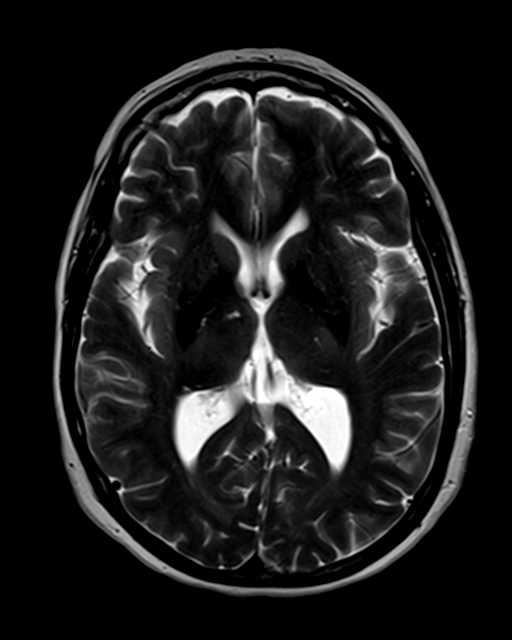
[im 26/26]
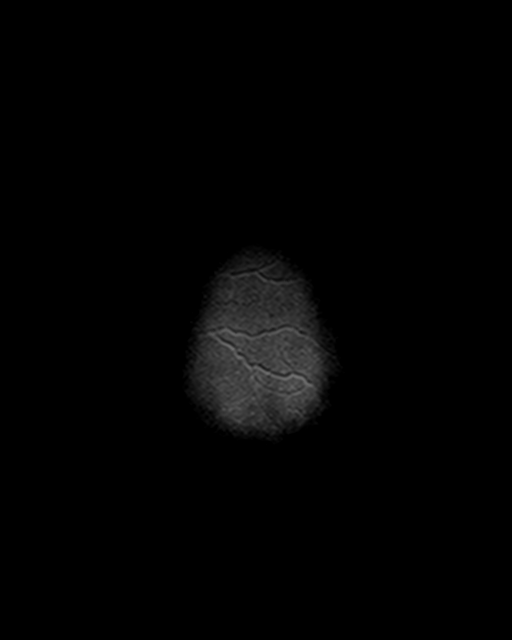

[Series 6: FLAIR · axial · 3.0mm · 0.45mm/px · z∈[-70,+88]mm · 3 of 35 slices shown]
[im 1/35]
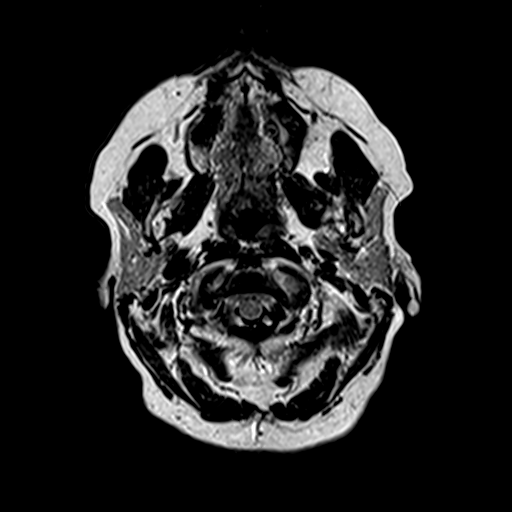
[im 18/35]
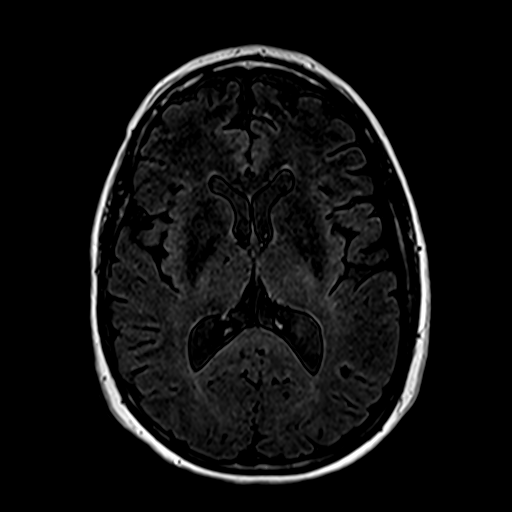
[im 35/35]
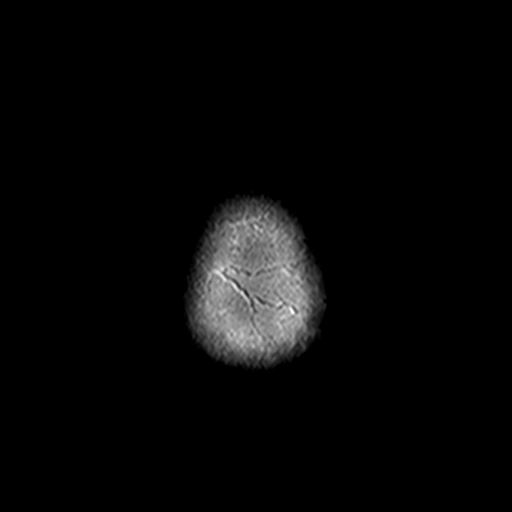

[Series 8: swi_images · axial · 4.0mm · 0.94mm/px · z∈[-68,+88]mm · 4 of 40 slices shown]
[im 1/40]
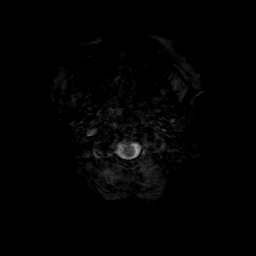
[im 14/40]
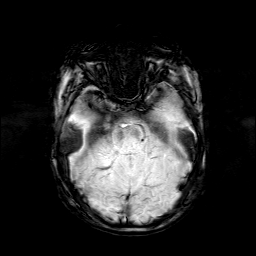
[im 27/40]
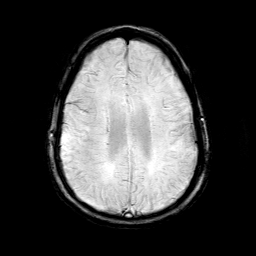
[im 40/40]
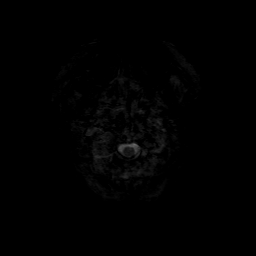

[Series 9: sag 3mm · sagittal · 3.0mm · 0.33mm/px · 1 of 11 slices shown]
[im 1/11]
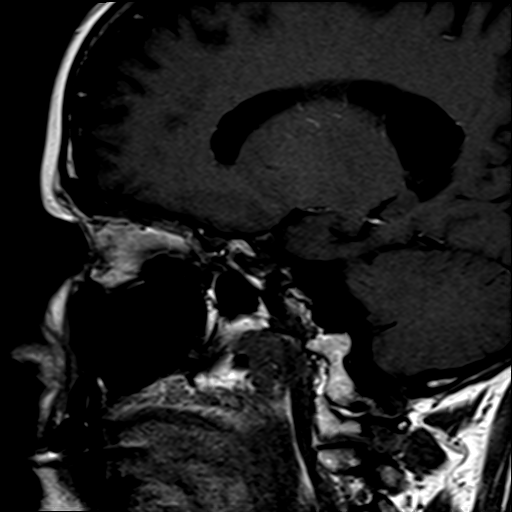

[Series 10: cor 3mm · coronal · 3.0mm · 0.33mm/px · 1 of 11 slices shown]
[im 1/11]
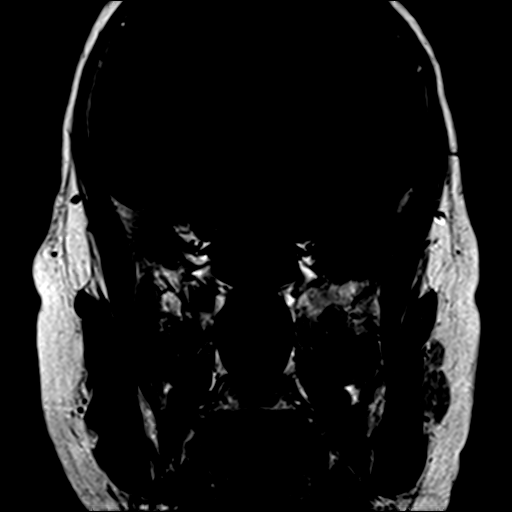

[Series 11: pre cor dynamic · coronal · non-contrast · 3.0mm · 0.35mm/px · 1 of 9 slices shown]
[im 1/9]
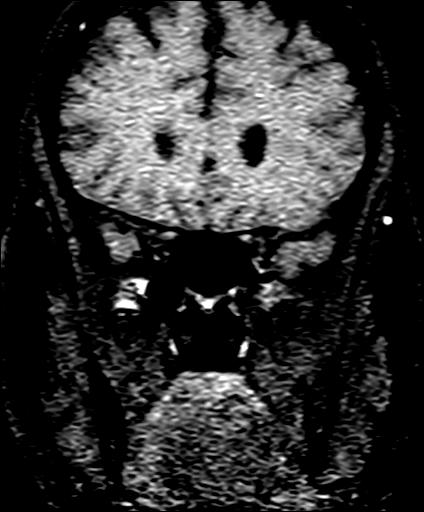

[Series 12: post fs cor · coronal · 3.0mm · 0.35mm/px · 1 of 9 slices shown (1 of 6)]
[im 1/9]
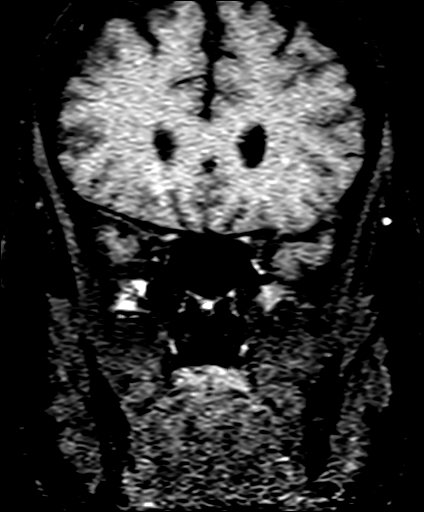

[Series 13: post fs cor · coronal · 3.0mm · 0.35mm/px · 1 of 9 slices shown (2 of 6)]
[im 1/9]
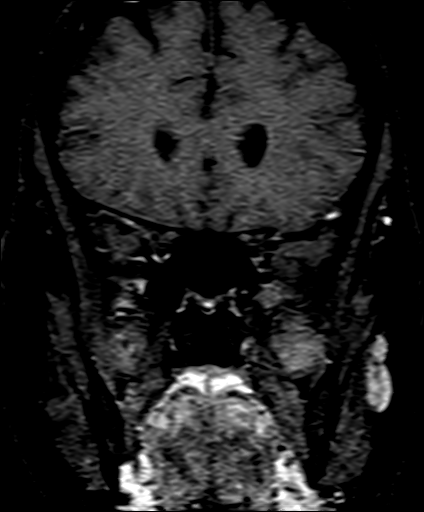

[Series 14: post fs cor · coronal · 3.0mm · 0.35mm/px · 1 of 9 slices shown (3 of 6)]
[im 1/9]
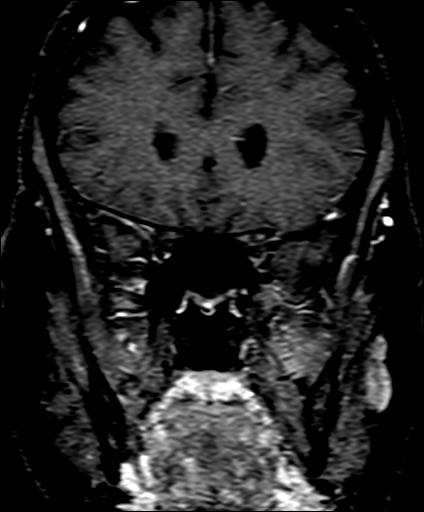

[Series 15: post fs cor · coronal · 3.0mm · 0.35mm/px · 1 of 9 slices shown (4 of 6)]
[im 1/9]
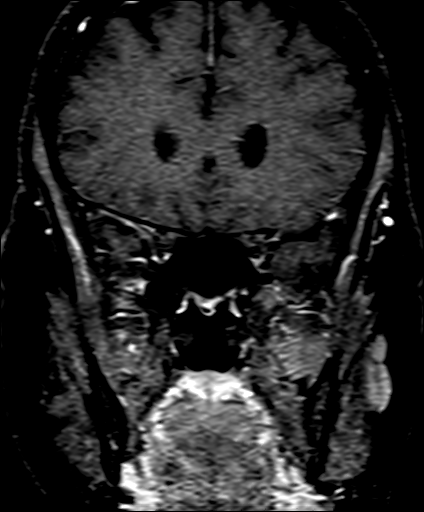

[Series 16: post fs cor · coronal · 3.0mm · 0.35mm/px · 1 of 9 slices shown (5 of 6)]
[im 1/9]
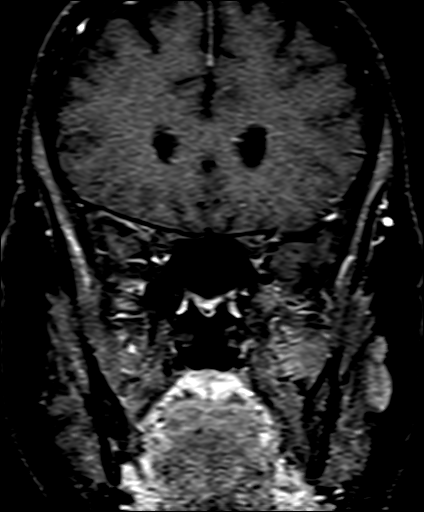

[Series 17: post fs cor · coronal · 3.0mm · 0.35mm/px · 1 of 9 slices shown (6 of 6)]
[im 1/9]
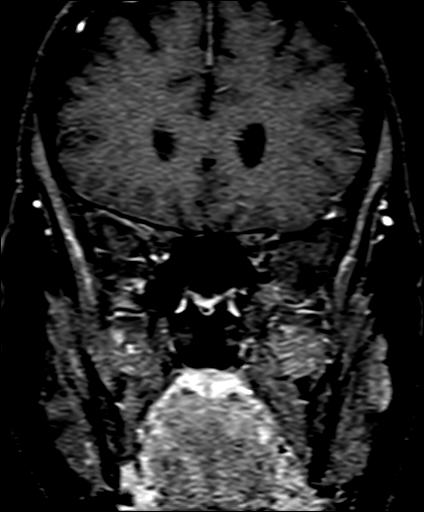

[34 of 48 positions shown; findings below may reference images not displayed]

FINDINGS: Brain: Interval collapse of the cystic pituitary mass which now
measures up to 4 mm and is purely intra sellar. No thickening of the
adjacent enhancing pituitary tissue. Normal appearance of the
cavernous sinus region and suprasellar cistern. No infundibulum
thickening.

Mild FLAIR hyperintensity along the posterior left frontal cortex is
unchanged. Few remote white matter insults with nonspecific pattern.
Normal brain volume. No hydrocephalus, collection, or infarct

Vascular: Normal flow voids and vascular enhancements

Skull and upper cervical spine: Normal marrow signal

Sinuses/Orbits: Negative
IMPRESSION: Interval collapse of the cystic pituitary mass which now measures 4
mm and is purely intra sellar.

## 2022-08-03 ENCOUNTER — Other Ambulatory Visit: Payer: Self-pay | Admitting: Internal Medicine

## 2022-08-03 DIAGNOSIS — D352 Benign neoplasm of pituitary gland: Secondary | ICD-10-CM

## 2022-08-22 ENCOUNTER — Ambulatory Visit
Admission: RE | Admit: 2022-08-22 | Discharge: 2022-08-22 | Disposition: A | Discharge status: Home / Self Care | Payer: BLUE CROSS/BLUE SHIELD | Source: Ambulatory Visit | Attending: Internal Medicine | Admitting: Internal Medicine

## 2022-08-22 DIAGNOSIS — D352 Benign neoplasm of pituitary gland: Secondary | ICD-10-CM

## 2022-08-22 MED ORDER — GADOPICLENOL 0.5 MMOL/ML IV SOLN
8.0000 mL | Freq: Once | INTRAVENOUS | Status: AC | PRN
  Administered 2022-08-22: 9 mL via INTRAVENOUS
